# Patient Record
Sex: Female | Born: 1985 | Hispanic: Yes | Marital: Single | Smoking: Never smoker
Health system: Southern US, Community
[De-identification: ages and names within clinical notes are randomized; demographics above are authoritative.]

## PROBLEM LIST (undated history)

## (undated) DIAGNOSIS — I8392 Asymptomatic varicose veins of left lower extremity: Secondary | ICD-10-CM

## (undated) DIAGNOSIS — M5126 Other intervertebral disc displacement, lumbar region: Secondary | ICD-10-CM

## (undated) DIAGNOSIS — R519 Headache, unspecified: Secondary | ICD-10-CM

## (undated) HISTORY — DX: Headache, unspecified: R51.9

## (undated) HISTORY — DX: Other intervertebral disc displacement, lumbar region: M51.26

## (undated) HISTORY — DX: Asymptomatic varicose veins of left lower extremity: I83.92

---

## 2022-02-11 ENCOUNTER — Other Ambulatory Visit: Payer: Self-pay

## 2022-02-11 ENCOUNTER — Emergency Department: Payer: Self-pay

## 2022-02-11 ENCOUNTER — Emergency Department
Admission: EM | Admit: 2022-02-11 | Discharge: 2022-02-11 | Disposition: A | Discharge status: Home / Self Care | Payer: Self-pay | Attending: Emergency Medicine | Admitting: Emergency Medicine

## 2022-02-11 ENCOUNTER — Encounter: Payer: Self-pay | Admitting: Emergency Medicine

## 2022-02-11 DIAGNOSIS — E876 Hypokalemia: Secondary | ICD-10-CM | POA: Insufficient documentation

## 2022-02-11 DIAGNOSIS — M545 Low back pain, unspecified: Secondary | ICD-10-CM

## 2022-02-11 DIAGNOSIS — M5126 Other intervertebral disc displacement, lumbar region: Secondary | ICD-10-CM | POA: Insufficient documentation

## 2022-02-11 LAB — CBC WITH DIFFERENTIAL/PLATELET
Abs Immature Granulocytes: 0.03 10*3/uL (ref 0.00–0.07)
Basophils Absolute: 0 10*3/uL (ref 0.0–0.1)
Basophils Relative: 0 %
Eosinophils Absolute: 0 10*3/uL (ref 0.0–0.5)
Eosinophils Relative: 0 %
HCT: 38 % (ref 36.0–46.0)
Hemoglobin: 12.7 g/dL (ref 12.0–15.0)
Immature Granulocytes: 0 %
Lymphocytes Relative: 27 %
Lymphs Abs: 2.7 10*3/uL (ref 0.7–4.0)
MCH: 29.1 pg (ref 26.0–34.0)
MCHC: 33.4 g/dL (ref 30.0–36.0)
MCV: 87.2 fL (ref 80.0–100.0)
Monocytes Absolute: 0.5 10*3/uL (ref 0.1–1.0)
Monocytes Relative: 5 %
Neutro Abs: 6.5 10*3/uL (ref 1.7–7.7)
Neutrophils Relative %: 68 %
Platelets: 268 10*3/uL (ref 150–400)
RBC: 4.36 MIL/uL (ref 3.87–5.11)
RDW: 13.2 % (ref 11.5–15.5)
WBC: 9.7 10*3/uL (ref 4.0–10.5)
nRBC: 0 % (ref 0.0–0.2)

## 2022-02-11 LAB — BASIC METABOLIC PANEL
Anion gap: 9 (ref 5–15)
BUN: 7 mg/dL (ref 6–20)
CO2: 20 mmol/L — ABNORMAL LOW (ref 22–32)
Calcium: 8.6 mg/dL — ABNORMAL LOW (ref 8.9–10.3)
Chloride: 108 mmol/L (ref 98–111)
Creatinine, Ser: 0.5 mg/dL (ref 0.44–1.00)
GFR, Estimated: >60 mL/min (ref >60)
Glucose, Bld: 119 mg/dL — ABNORMAL HIGH (ref 70–99)
Potassium: 2.9 mmol/L — ABNORMAL LOW (ref 3.5–5.1)
Sodium: 137 mmol/L (ref 135–145)

## 2022-02-11 LAB — HCG, QUANTITATIVE, PREGNANCY: hCG, Beta Chain, Quant, S: <1 m[IU]/mL (ref <5)

## 2022-02-11 MED ORDER — ONDANSETRON HCL 4 MG/2ML IJ SOLN
4.0000 mg | Freq: Once | INTRAMUSCULAR | Status: AC
  Administered 2022-02-11: 4 mg via INTRAVENOUS
  Filled 2022-02-11: qty 2

## 2022-02-11 MED ORDER — ONDANSETRON 4 MG PO TBDP: 4.0000 mg | ORAL_TABLET | Freq: Four times a day (QID) | ORAL | 0 refills | Status: DC | PRN

## 2022-02-11 MED ORDER — SODIUM CHLORIDE 0.9 % IV BOLUS (SEPSIS)
1000.0000 mL | Freq: Once | INTRAVENOUS | Status: AC
Start: 2022-02-11 — End: 2022-02-11
  Administered 2022-02-11: 1000 mL via INTRAVENOUS

## 2022-02-11 MED ORDER — HYDROMORPHONE HCL 1 MG/ML IJ SOLN
1.0000 mg | Freq: Once | INTRAMUSCULAR | Status: AC
  Administered 2022-02-11: 1 mg via INTRAVENOUS
  Filled 2022-02-11: qty 1

## 2022-02-11 MED ORDER — POTASSIUM CHLORIDE CRYS ER 20 MEQ PO TBCR
40.0000 meq | EXTENDED_RELEASE_TABLET | Freq: Once | ORAL | Status: AC
  Administered 2022-02-11: 40 meq via ORAL
  Filled 2022-02-11: qty 2

## 2022-02-11 MED ORDER — LIDOCAINE 5 % EX PTCH
1.0000 | MEDICATED_PATCH | Freq: Once | CUTANEOUS | Status: DC
  Administered 2022-02-11: 1 via TRANSDERMAL
  Filled 2022-02-11: qty 1

## 2022-02-11 MED ORDER — LIDOCAINE 5 % EX PTCH: 1.0000 | MEDICATED_PATCH | CUTANEOUS | 0 refills | Status: AC

## 2022-02-11 MED ORDER — IBUPROFEN 800 MG PO TABS: 800.0000 mg | ORAL_TABLET | Freq: Three times a day (TID) | ORAL | 0 refills | Status: DC | PRN

## 2022-02-11 MED ORDER — DEXAMETHASONE SODIUM PHOSPHATE 10 MG/ML IJ SOLN
10.0000 mg | Freq: Once | INTRAMUSCULAR | Status: AC
  Administered 2022-02-11: 10 mg via INTRAVENOUS
  Filled 2022-02-11: qty 1

## 2022-02-11 MED ORDER — OXYCODONE-ACETAMINOPHEN 5-325 MG PO TABS: 2.0000 | ORAL_TABLET | Freq: Four times a day (QID) | ORAL | 0 refills | Status: DC | PRN

## 2022-02-11 MED ORDER — POTASSIUM CHLORIDE CRYS ER 20 MEQ PO TBCR: 40.0000 meq | EXTENDED_RELEASE_TABLET | Freq: Every day | ORAL | 0 refills | Status: DC

## 2022-02-11 MED ORDER — PREDNISONE 10 MG PO TABS: 60.0000 mg | ORAL_TABLET | ORAL | 0 refills | Status: DC

## 2022-02-11 MED ORDER — KETOROLAC TROMETHAMINE 30 MG/ML IJ SOLN
30.0000 mg | Freq: Once | INTRAMUSCULAR | Status: AC
Start: 2022-02-11 — End: 2022-02-11
  Administered 2022-02-11: 30 mg via INTRAVENOUS
  Filled 2022-02-11: qty 1

## 2022-02-11 NOTE — ED Provider Notes (Addendum)
? ?[facility] ?Provider Note ? ? ? Event Date/Time  ? First MD Initiated Contact with Patient 02/11/22 0305   ?  (approximate) ? ? ?History  ? ?Back Pain ? ? ?HPI ? ?Courtney Soto is a 36 y.o. female with no significant past medical history who presents to the emergency department with her family with complaints of lower back pain and unable to walk due to pain.  Family reports patient was lifting up her daughter today when she felt a pop in her lower back and was suddenly unable to move.  She has had severe pain since.  Denies numbness, tingling, weakness, bowel or bladder incontinence, urinary retention.  No fever.  No history of epidural injections other than an epidural for pregnancy a year ago.  No previous back surgeries.  Has never had issues with back pain. ? ? ?History provided by patient and family. ? ? ? ?History reviewed. No pertinent past medical history. ? ?History reviewed. No pertinent surgical history. ? ?MEDICATIONS:  ?Prior to Admission medications   ?Not on File  ? ? ?Physical Exam  ? ?Triage Vital Signs: ?ED Triage Vitals  ?Enc Vitals Group  ?   BP 02/11/22 0217 (!) 97/59  ?   Pulse Rate 02/11/22 0217 69  ?   Resp 02/11/22 0217 16  ?   Temp 02/11/22 0217 98.9 ?F (37.2 ?C)  ?   Temp Source 02/11/22 0217 Oral  ?   SpO2 02/11/22 0217 99 %  ?   Weight 02/11/22 0220 180 lb (81.6 kg)  ?   Height 02/11/22 0220 5\' 3"  (1.6 m)  ?   Head Circumference --   ?   Peak Flow --   ?   Pain Score 02/11/22 0219 10  ?   Pain Loc --   ?   Pain Edu? --   ?   Excl. in GC? --   ? ? ?Most recent vital signs: ?Vitals:  ? 02/11/22 0217 02/11/22 0532  ?BP: (!) 97/59 103/68  ?Pulse: 69 62  ?Resp: 16 17  ?Temp: 98.9 ?F (37.2 ?C) 98.8 ?F (37.1 ?C)  ?SpO2: 99% 98%  ? ? ?CONSTITUTIONAL: Alert and oriented and responds appropriately to questions.  Appears very uncomfortable, tearful, afebrile ?HEAD: Normocephalic, atraumatic ?EYES: Conjunctivae clear, pupils appear equal, sclera nonicteric ?ENT: normal  nose; moist mucous membranes ?NECK: Supple, normal ROM ?CARD: RRR; S1 and S2 appreciated; no murmurs, no clicks, no rubs, no gallops ?RESP: Normal chest excursion without splinting or tachypnea; breath sounds clear and equal bilaterally; no wheezes, no rhonchi, no rales, no hypoxia or respiratory distress, speaking full sentences ?ABD/GI: Normal bowel sounds; non-distended; soft, non-tender, no rebound, no guarding, no peritoneal signs ?BACK: The back appears normal, tender to palpation over the mid and lower lumbar spine without step-off or deformity.  No redness or warmth.  No ecchymosis.  No rash or other lesions. ?EXT: Normal ROM in all joints; no deformity noted, no edema; no cyanosis ?SKIN: Normal color for age and race; warm; no rash on exposed skin ?NEURO: Patient moves her upper extremities without difficulty but has a hard time moving her legs due to pain but the movement in her legs seems normal.  She has no hyperreflexia.  No saddle anesthesia.  Reports normal sensation diffusely.  Unable to test gait due to significant pain. ?PSYCH: The patient's mood and manner are appropriate. ? ? ?ED Results / Procedures / Treatments  ? ?LABS: ?(all labs ordered are listed, but only  abnormal results are displayed) ?Labs Reviewed  ?BASIC METABOLIC PANEL - Abnormal; Notable for the following components:  ?    Result Value  ? Potassium 2.9 (*)   ? CO2 20 (*)   ? Glucose, Bld 119 (*)   ? Calcium 8.6 (*)   ? All other components within normal limits  ?HCG, QUANTITATIVE, PREGNANCY  ?CBC WITH DIFFERENTIAL/PLATELET  ? ? ? ?EKG: ? ? EKG Interpretation ? ?Date/Time:  Saturday February 11 2022 05:55:43 EDT ?Ventricular Rate:  64 ?PR Interval:  166 ?QRS Duration: 106 ?QT Interval:  425 ?QTC Calculation: 439 ?R Axis:   56 ?Text Interpretation: Sinus arrhythmia Confirmed by Rochele RaringWard, Kasai Beltran [id number]) on 02/11/2022 6:14:09 AM ?  ? ?  ? ? ? ? ?RADIOLOGY: ?My personal review and interpretation of imaging: MRI shows L5-S1 central disc  herniation. ? ?I have personally reviewed all radiology reports.   ?MR LUMBAR SPINE WO CONTRAST ? ?Result Date: 02/11/2022 ?CLINICAL DATA:  36 year old female with acute onset low back pain ?heard a pop? when holding daughter. EXAM: MRI LUMBAR SPINE WITHOUT CONTRAST TECHNIQUE: Multiplanar, multisequence MR imaging of the lumbar spine was performed. No intravenous contrast was administered. COMPARISON:  None. FINDINGS: Segmentation: Lumbar segmentation appears to be normal and will be designated as such for this report. Alignment:  Straightening of lumbar lordosis. No spondylolisthesis. Vertebrae: Generalized decreased T1 marrow signal in the visible spine and pelvis, but no associated STIR hyperintensity or marrow edema. No discrete bone lesion. Visible pelvis appears intact. Conus medullaris and cauda equina: Conus extends to the T12 level. No lower spinal cord or conus signal abnormality. Capacious spinal canal. Normal cauda equina nerve roots. Paraspinal and other soft tissues: Negative; small benign appearing right renal cyst (no imaging follow-up recommended). Disc levels: T11-T12 through L2-L3: Negative. L3-L4: Negative disc. Mild facet hypertrophy. Capacious spinal canal and no stenosis. L4-L5: Negative. L5-S1: Disc desiccation. Small to moderate sized central disc protrusion (series 9, image 33) in proximity to the descending S1 nerve roots and thecal sac although underlying epidural lipomatosis here. No nerve root impingement or significant spinal stenosis. Mild facet hypertrophy. No foraminal involvement or stenosis. IMPRESSION: 1. 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 Underlying capacious spinal canal with no spinal stenosis, although this disc might be a source of S1 radiculitis. 2. Nonspecific generalized decreased T1 marrow signal in the visible spine and pelvis. This is nonspecific, query Anemia. Although this can be caused by marrow infiltrative  processes, other common causes include smoking and obesity. Electronically Signed   By: Odessa FlemingH  Hall M.D.   On: 02/11/2022 04:52   ? ? ?PROCEDURES: ? ?Critical Care performed: No ? ? ?CRITICAL CARE ?Performed by: Baxter HireKristen Nyshaun Standage ? ? ?Total critical care time: 0 minutes ? ?Critical care time was exclusive of separately billable procedures and treating other patients. ? ?Critical care was necessary to treat or prevent imminent or life-threatening deterioration. ? ?Critical care was time spent personally by me on the following activities: development of treatment plan with patient and/or surrogate as well as nursing, discussions with consultants, evaluation of patient's response to treatment, examination of patient, obtaining history from patient or surrogate, ordering and performing treatments and interventions, ordering and review of laboratory studies, ordering and review of radiographic studies, pulse oximetry and re-evaluation of patient's condition. ? ? ?Procedures ? ? ? ?IMPRESSION / MDM / ASSESSMENT AND PLAN / ED COURSE  ?I reviewed the triage vital signs and the nursing notes. ? ? ? ?  Patient here with sudden onset lower back pain after picking up her daughter.  Now unable to ambulate. ? ? ? ? ?DIFFERENTIAL DIAGNOSIS (includes but not limited to):   Suspect ruptured disc.  Less likely fracture, cauda equina, epidural abscess or hematoma, discitis or osteomyelitis, transverse myelitis.  Muscle strain, spasm also on the differential. ? ? ?PLAN: Given patient is not able to ambulate and appears to be in severe pain, will obtain MRI of the lumbar spine.  I suspect that she has a ruptured disc which is why she heard/felt a pop today.  We will give pain medication, steroids, anti-inflammatories. ? ? ?MEDICATIONS GIVEN IN ED: ?Medications  ?lidocaine (LIDODERM) 5 % 1 patch (1 patch Transdermal Patch Applied 02/11/22 0418)  ?HYDROmorphone (DILAUDID) injection 1 mg (1 mg Intravenous Given 02/11/22 0417)  ?sodium chloride 0.9 %  bolus 1,000 mL (0 mLs Intravenous Stopped 02/11/22 0551)  ?ketorolac (TORADOL) 30 MG/ML injection 30 mg (30 mg Intravenous Given 02/11/22 0415)  ?ondansetron [facility]) injection 4 mg (4 mg Intravenous Given 02/11/22 0414)  ?de

## 2022-02-11 NOTE — ED Triage Notes (Signed)
Pt reports yesterday afternoon she tried to hold her daughter who weights approximately 25 lbs, pt reports she heard a loud "pop" and has no pt been able to talk without assistance. Reports able to void but needs assistance to go to he bathroom. EMS administered of Fentanyl in route, pt reports medication helped some. Received IV infusion per EMS.  ?

## 2022-02-11 NOTE — ED Notes (Signed)
Patient transported to MRI 

## 2022-02-11 NOTE — ED Notes (Signed)
Discharge medications reviewed with patient via MD using interpreter regarding breast feeding at home and possible side effects. ?

## 2022-02-11 NOTE — ED Notes (Signed)
Pt ambulated with assistance x 1. Tolerated well.  ?

## 2022-02-11 NOTE — Discharge Instructions (Addendum)

## 2022-05-31 ENCOUNTER — Encounter: Payer: Self-pay | Admitting: Gerontology

## 2022-05-31 ENCOUNTER — Ambulatory Visit: Payer: Self-pay | Admitting: Gerontology

## 2022-05-31 ENCOUNTER — Other Ambulatory Visit: Payer: Self-pay

## 2022-05-31 VITALS — BP 110/77 | HR 82 | Temp 98.2°F | Wt 180.3 lb

## 2022-05-31 DIAGNOSIS — Z7689 Persons encountering health services in other specified circumstances: Secondary | ICD-10-CM | POA: Insufficient documentation

## 2022-05-31 DIAGNOSIS — Z8542 Personal history of malignant neoplasm of other parts of uterus: Secondary | ICD-10-CM | POA: Insufficient documentation

## 2022-05-31 DIAGNOSIS — M545 Low back pain, unspecified: Secondary | ICD-10-CM | POA: Insufficient documentation

## 2022-05-31 DIAGNOSIS — K029 Dental caries, unspecified: Secondary | ICD-10-CM | POA: Insufficient documentation

## 2022-05-31 MED ORDER — NAPROXEN 250 MG PO TABS
250.0000 mg | ORAL_TABLET | Freq: Two times a day (BID) | ORAL | 1 refills | Status: DC
  Filled 2022-05-31: qty 24, 12d supply, fill #0

## 2022-05-31 NOTE — Progress Notes (Signed)
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 She has not had a pap smear in 3 years. Back pain when standing and doing chores.  Pain scale 6-7.

## 2022-05-31 NOTE — Progress Notes (Signed)
New Patient Office Visit  Subjective    Patient ID: Courtney Soto, female    DOB: Jun 05, 1986  Age: 36 y.o. MRN: [id number]  CC:  Chief Complaint  Patient presents with   Back Pain   Annual Exam    Patient is having pain in back when standing and doing chores.  Pain level 6-7    HPI Courtney Soto is  a 36 y/o female who has no significant past medical history presents to establish care. She c/o intermittent back pain that started 3 months ago after her back popped while picking up her daughter.  She was seen at the emergency room on 02/11/2022 for complaint of back pain.  MRI of the spine done showed,1. 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 Underlying capacious spinal canal with no spinal stenosis, although this disc might be a source of S1 radiculitis.   2. Nonspecific generalized decreased T1 marrow signal in the visible spine and pelvis. This is nonspecific, query Anemia. Although this can be caused by marrow infiltrative processes, other common causes include smoking and obesity.  She describes pain as dull 3/10, nonradiating to her mid lower back.  She states that taking ibuprofen relieves symptoms.  She reports that standing and mopping aggravates pain.  She denies bowel, bladder incontinence, saddle anesthesia, motor no muscle weakness.  She also reports that she has not had Pap smear done.  Also complain tooth cavities, denies pain erythema and abscess.  Overall, she states that she is doing well and offers no further complaint.    Outpatient Encounter Medications as of 05/31/2022  Medication Sig   naproxen (NAPROSYN) 250 MG tablet Take 1 tablet (250 mg total) by mouth 2 (two) times daily with a meal.   ibuprofen (ADVIL) 800 MG tablet Take 1 tablet (800 mg total) by mouth every 8 (eight) hours as needed for mild pain.   [DISCONTINUED] ondansetron (ZOFRAN-ODT) 4 MG disintegrating tablet Take 1 tablet (4 mg total) by mouth every 6  (six) hours as needed for nausea or vomiting.   [DISCONTINUED] oxyCODONE-acetaminophen (PERCOCET/ROXICET) 5-325 MG tablet Take 2 tablets by mouth every 6 (six) hours as needed.   [DISCONTINUED] potassium chloride SA (KLOR-CON M) 20 MEQ tablet Take 2 tablets (40 mEq total) by mouth daily.   [DISCONTINUED] predniSONE (DELTASONE) 10 MG tablet Take 6 tablets (60 mg total) by mouth as directed. Take 60 mg x 2 days then 50 mg x 2 days then 40 mg x 2 days then 30 mg x 2 days then 20 mg x 2 days then 10 mg x 2 days then STOP   No facility-administered encounter medications on file as of 05/31/2022.    No past medical history on file.  No past surgical history on file.  No family history on file.  Social History   Socioeconomic History   Marital status: Single    Spouse name: Not on file   Number of children: Not on file   Years of education: Not on file   Highest education level: Not on file  Occupational History   Not on file  Tobacco Use   Smoking status: Never   Smokeless tobacco: Never  Substance and Sexual Activity   Alcohol use: Never   Drug use: Never   Sexual activity: Never  Other Topics Concern   Not on file  Social History Narrative   Not on file   Social Determinants of Health   Financial Resource Strain: Not  on file  Food Insecurity: Not on file  Transportation Needs: Not on file  Physical Activity: Not on file  Stress: Not on file  Social Connections: Not on file  Intimate Partner Violence: Not At Risk (05/31/2022)   Humiliation, Afraid, Rape, and Kick questionnaire    Fear of Current or Ex-Partner: No    Emotionally Abused: No    Physically Abused: No    Sexually Abused: No    Review of Systems  Constitutional: Negative.   HENT: Negative.    Eyes: Negative.   Respiratory: Negative.    Cardiovascular: Negative.   Gastrointestinal: Negative.   Genitourinary: Negative.   Musculoskeletal:  Positive for back pain.  Skin: Negative.   Neurological:  Negative.   Endo/Heme/Allergies: Negative.   Psychiatric/Behavioral: Negative.          Objective    BP 110/77 (BP Location: Right Arm, Patient Position: Sitting, Cuff Size: Large)   Pulse 82   Temp 98.2 F (36.8 C) (Oral)   Wt 180 lb 4.8 oz (81.8 kg)   LMP 05/05/2022 (Approximate)   SpO2 98%   BMI 31.94 kg/m   Physical Exam HENT:     Head: Normocephalic and atraumatic.     Nose: Nose normal.     Mouth/Throat:     Mouth: Mucous membranes are moist.     Pharynx: Oropharynx is clear.  Eyes:     Extraocular Movements: Extraocular movements intact.     Conjunctiva/sclera: Conjunctivae normal.     Pupils: Pupils are equal, round, and reactive to light.  Cardiovascular:     Rate and Rhythm: Normal rate and regular rhythm.     Pulses: Normal pulses.     Heart sounds: Normal heart sounds.  Pulmonary:     Effort: Pulmonary effort is normal.     Breath sounds: Normal breath sounds.  Abdominal:     General: Bowel sounds are normal.     Palpations: Abdomen is soft.  Genitourinary:    Comments: Deferred per patient Musculoskeletal:        General: Tenderness (mild tenderness to mid lower back with palpation) present. Normal range of motion.     Cervical back: Normal range of motion.  Skin:    General: Skin is warm.  Neurological:     General: No focal deficit present.     Mental Status: She is alert and oriented to person, place, and time. Mental status is at baseline.  Psychiatric:        Mood and Affect: Mood normal.        Behavior: Behavior normal.        Thought Content: Thought content normal.        Judgment: Judgment normal.         Assessment & Plan:   1. Encounter to establish care -Routine labs will be checked.  She was provided with [place] number to call and schedule Pap smear. - Comp Met (CMET); Future - Lipid panel; Future - HgB A1c; Future - HgB A1c - Lipid panel - Comp Met (CMET)  2. Chronic midline low back pain,  unspecified whether sciatica present -She was started on naproxen to 250 mg twice daily for back pain, was educated on medication side effect and advised to notify clinic.  She will follow-up with Dr. Jefm Bryant for further evaluation of back pain.  She was advised to go to the emergency room for worsening symptoms. - naproxen (NAPROSYN) 250 MG tablet; Take 1 tablet (250 mg total)  by mouth 2 (two) times daily with a meal.  Dispense: 30 tablet; Refill: 1  3. Dental cavities -She was provided with dental application for referral.   Return in about 3 weeks (around 06/21/2022), or if symptoms worsen or fail to improve.   Burgandy Hackworth Jerold Coombe, NP

## 2022-06-01 LAB — HEMOGLOBIN A1C
Est. average glucose Bld gHb Est-mCnc: 97 mg/dL
Hgb A1c MFr Bld: 5 % (ref 4.8–5.6)

## 2022-06-01 LAB — COMPREHENSIVE METABOLIC PANEL
ALT: 11 IU/L (ref 0–32)
AST: 15 IU/L (ref 0–40)
Albumin/Globulin Ratio: 1.7 (ref 1.2–2.2)
Albumin: 4.8 g/dL (ref 3.9–4.9)
Alkaline Phosphatase: 110 IU/L (ref 44–121)
BUN/Creatinine Ratio: 12 (ref 9–23)
BUN: 8 mg/dL (ref 6–20)
Bilirubin Total: 0.6 mg/dL (ref 0.0–1.2)
CO2: 21 mmol/L (ref 20–29)
Calcium: 9.7 mg/dL (ref 8.7–10.2)
Chloride: 104 mmol/L (ref 96–106)
Creatinine, Ser: 0.65 mg/dL (ref 0.57–1.00)
Globulin, Total: 2.8 g/dL (ref 1.5–4.5)
Glucose: 80 mg/dL (ref 70–99)
Potassium: 3.9 mmol/L (ref 3.5–5.2)
Sodium: 141 mmol/L (ref 134–144)
Total Protein: 7.6 g/dL (ref 6.0–8.5)
eGFR: 118 mL/min/{1.73_m2} (ref >59)

## 2022-06-01 LAB — LIPID PANEL
Chol/HDL Ratio: 2.8 ratio (ref 0.0–4.4)
Cholesterol, Total: 142 mg/dL (ref 100–199)
HDL: 50 mg/dL (ref >39)
LDL Chol Calc (NIH): 73 mg/dL (ref 0–99)
Triglycerides: 106 mg/dL (ref 0–149)
VLDL Cholesterol Cal: 19 mg/dL (ref 5–40)

## 2022-06-07 ENCOUNTER — Other Ambulatory Visit: Payer: Self-pay

## 2022-06-12 ENCOUNTER — Ambulatory Visit (LOCAL_COMMUNITY_HEALTH_CENTER): Payer: Self-pay | Admitting: Nurse Practitioner

## 2022-06-12 ENCOUNTER — Encounter: Payer: Self-pay | Admitting: Nurse Practitioner

## 2022-06-12 VITALS — BP 108/73 | Ht 63.0 in | Wt 179.4 lb

## 2022-06-12 DIAGNOSIS — Z113 Encounter for screening for infections with a predominantly sexual mode of transmission: Secondary | ICD-10-CM

## 2022-06-12 DIAGNOSIS — Z3009 Encounter for other general counseling and advice on contraception: Secondary | ICD-10-CM

## 2022-06-12 DIAGNOSIS — Z01419 Encounter for gynecological examination (general) (routine) without abnormal findings: Secondary | ICD-10-CM

## 2022-06-12 DIAGNOSIS — Z3202 Encounter for pregnancy test, result negative: Secondary | ICD-10-CM

## 2022-06-12 LAB — WET PREP FOR TRICH, YEAST, CLUE: Trichomonas Exam: NEGATIVE

## 2022-06-12 LAB — HM HIV SCREENING LAB: HM HIV Screening: NEGATIVE

## 2022-06-12 LAB — PREGNANCY, URINE: Preg Test, Ur: NEGATIVE

## 2022-06-12 NOTE — Progress Notes (Signed)
Patient seen for PE, STD testing, PAP. PT, BCM. Wet prep reviewed, no tx per standing orders. Condoms given. Nexplanon appt made for 06/22/22. Patient instructed to use condoms, to prevent pregnancy.

## 2022-06-12 NOTE — Progress Notes (Addendum)
[facility] DEPARTMENT [facility] [address] Main Number: [phone]    Family Planning Visit- Initial Visit  Subjective:  Courtney Soto is a 36 y.o.  [phone]   being seen today for an initial annual visit and to discuss reproductive life planning.  The patient is currently using Female Condom for pregnancy prevention. Patient reports   does not want a pregnancy in the next year.  Desires a Nexplanon today.   report they are looking for a method that provides High efficacy at preventing pregnancy  Patient has the following medical conditions has Encounter to establish care; History of uterine cancer; Low back pain; and Dental cavities on their problem list.  Chief Complaint  Patient presents with   Contraception    Physical and birth control     Patient reports to clinic today for a physical, STD screening, and discussion of birth control.    Body mass index is 31.78 kg/m. - Patient is eligible for diabetes screening based on BMI and age >26?  not applicable HA1C ordered? not applicable  Patient reports 1  partner/s in last year. Desires STI screening?  Yes  Has patient been screened once for HCV in the past?  No  No results found for: "HCVAB"  Does the patient have current drug use (including MJ), have a partner with drug use, and/or has been incarcerated since last result? No  If yes-- Screen for HCV through [facility]   Does the patient meet criteria for HBV testing? No  Criteria:  -Household, sexual or needle sharing contact with HBV -History of drug use -HIV positive -Those with known Hep C   Health Maintenance Due  Topic Date Due   COVID-19 Vaccine (1) Never done   HIV Screening  Never done   Hepatitis C Screening  Never done   PAP SMEAR-Modifier  Never done    Review of Systems  Constitutional:  Negative for chills, fever, malaise/fatigue and weight loss.  HENT:  Negative for congestion, hearing loss and  sore throat.   Eyes:  Negative for blurred vision, double vision and photophobia.  Respiratory:  Negative for shortness of breath.   Cardiovascular:  Negative for chest pain.  Gastrointestinal:  Negative for abdominal pain, blood in stool, constipation, diarrhea, heartburn, nausea and vomiting.  Genitourinary:  Negative for dysuria and frequency.       Vaginal discharge and burning x 2 weeks   Musculoskeletal:  Negative for back pain, joint pain and neck pain.  Skin:  Negative for itching and rash.  Neurological:  Positive for headaches. Negative for dizziness and weakness.  Endo/Heme/Allergies:  Bruises/bleeds easily.  Psychiatric/Behavioral:  Negative for depression, substance abuse and suicidal ideas.     The following portions of the patient's history were reviewed and updated as appropriate: allergies, current medications, past family history, past medical history, past social history, past surgical history and problem list. Problem list updated.   See flowsheet for other program required questions.  Objective:   Vitals:   06/12/22 1447  BP: 108/73  Weight: 179 lb 6.4 oz (81.4 kg)  Height: 5\' 3"  (1.6 m)    Physical Exam Constitutional:      Appearance: Normal appearance.  HENT:     Head: Normocephalic. No abrasion, masses or laceration. Hair is normal.     Jaw: No tenderness or swelling.     Right Ear: External ear normal.     Left Ear: External ear normal.  Nose: Nose normal.     Mouth/Throat:     Lips: Pink. No lesions.     Mouth: Mucous membranes are moist. No lacerations or oral lesions.     Dentition: No dental caries.     Tongue: No lesions.     Palate: No mass and lesions.     Pharynx: No pharyngeal swelling, oropharyngeal exudate, posterior oropharyngeal erythema or uvula swelling.     Tonsils: No tonsillar exudate or tonsillar abscesses.  Eyes:     Pupils: Pupils are equal, round, and reactive to light.  Neck:     Thyroid: No thyroid mass, thyromegaly  or thyroid tenderness.  Cardiovascular:     Rate and Rhythm: Normal rate and regular rhythm.  Pulmonary:     Effort: Pulmonary effort is normal.     Breath sounds: Normal breath sounds.  Chest:  Breasts:    Right: Mass present. No swelling, nipple discharge, skin change or tenderness.     Left: Normal. No swelling, mass, nipple discharge, skin change or tenderness.       Comments: 2 x 2 cm non tender thickening/mass noted to right breast at 12 o'clock  Abdominal:     General: Abdomen is flat. Bowel sounds are normal.     Palpations: Abdomen is soft.     Tenderness: There is abdominal tenderness in the left lower quadrant. There is no rebound.  Genitourinary:    Pubic Area: No rash or pubic lice.      Labia:        Right: No rash, tenderness or lesion.        Left: No rash, tenderness or lesion.      Vagina: Normal. No vaginal discharge, erythema, tenderness or lesions.     Cervix: Cervical motion tenderness present. No discharge, lesion or erythema.     Uterus: Normal.      Adnexa:        Right: No tenderness.         Left: No tenderness.       Rectum: Normal.     Comments: Amount Discharge: small  Odor: No pH: less than 4.5 Adheres to vaginal wall: No Color: color of discharge matches the  swab Musculoskeletal:     Cervical back: Full passive range of motion without pain and normal range of motion.     Comments: Varicose veins noted to the left lower quadrant.    Lymphadenopathy:     Cervical: No cervical adenopathy.     Right cervical: No superficial, deep or posterior cervical adenopathy.    Left cervical: No superficial, deep or posterior cervical adenopathy.     Upper Body:     Right upper body: No supraclavicular, axillary or epitrochlear adenopathy.     Left upper body: No supraclavicular, axillary or epitrochlear adenopathy.     Lower Body: No right inguinal adenopathy. No left inguinal adenopathy.  Skin:    General: Skin is warm and dry.     Findings: No  erythema, laceration, lesion or rash.  Neurological:     Mental Status: She is alert and oriented to person, place, and time.  Psychiatric:        Attention and Perception: Attention normal.        Mood and Affect: Mood normal.        Speech: Speech normal.        Behavior: Behavior normal. Behavior is cooperative.       Assessment and Plan:  Courtney Soto is a  36 y.o. female presenting to the [facility] Department for an initial annual wellness/contraceptive visit  Contraception counseling: Reviewed options based on patient desire and reproductive life plan. Patient is interested in Female Condom. This was provided to the patient today.   Risks, benefits, and typical effectiveness rates were reviewed.  Questions were answered.  Written information was also given to the patient to review.    The patient will follow up in  1 years for surveillance.  The patient was told to call with any further questions, or with any concerns about this method of contraception.  Emphasized use of condoms 100% of the time for STI prevention.  Need for ECP was assessed. ECP not offered due to reports of no unprotected sex.  UPT today, PT negative.    1. Family planning counseling -36 year old female in clinic today for a physical, STD screening, and to discuss birth control. -ROS reviewed, patient reports vaginal discharge, headaches, and easiness to bruise.  Patient agrees to STD screening today.  Patient also advised to drink 6-8 bottles of water and eat 6 small meals frequently.  Patient also encouraged to receive an updated eye exam, obtain a PCP, and get proper rest.  -Varicose veins noted to left lower quadrant.  Encouraged to continue to follow up with PCP and also encouraged patient to utilize support stockings.  Resources for purchase given to patient.    -Patient desires a Nexplanon but is unable to stay for placement.  Patient encouraged to schedule an appointment and advised to  use condoms with all sex and/or abstain from sex until placement of Nexplanon.   2. Screening examination for venereal disease -STD screening today. -Patient accepted all screenings including oral, vaginal, rectal CT/GC, wet prep, and bloodwork for HIV/RPR.  Patient meets criteria for HepB screening? No. Ordered? No - low risk  Patient meets criteria for HepC screening? No. Ordered? No - low risk   Treat wet prep per standing order Discussed time line for State Lab results and that patient will be called with positive results and encouraged patient to call if she had not heard in 2 weeks.  Counseled to return or seek care for continued or worsening symptoms Recommended condom use with all sex  Patient is currently using  condoms   to prevent pregnancy.    - HIV [place] [facility] - Syphilis Serology, [facility] - Chlamydia/Gonorrhea [facility] - Chlamydia/Gonorrhea [place] - Chlamydia/Gonorrhea [place] [facility] - WET PREP FOR TRICH, YEAST, CLUE  3. Well woman exam with routine gynecological exam -Well woman exam. -CBE today.   2 x 2 cm non tender thickening/mass noted to right breast noted at 12 o'clock.  Referral submitted to [facility]CP.   -PAP performed today.   - IGP, Aptima HPV   Total time spent: 30 minutes   Return in about 1 year (around 06/13/2023) for Annual well-woman exam.  Future Appointments  Date Time Provider Department Center  06/21/2022 12:30 PM [place], Francoise Ceo, NP [facility] None    Glenna Fellows, FNP

## 2022-06-15 LAB — IGP, APTIMA HPV
HPV Aptima: NEGATIVE
PAP Smear Comment: 0

## 2022-06-21 ENCOUNTER — Other Ambulatory Visit: Payer: Self-pay

## 2022-06-21 ENCOUNTER — Ambulatory Visit: Payer: Self-pay | Admitting: Gerontology

## 2022-06-21 ENCOUNTER — Encounter: Payer: Self-pay | Admitting: Gerontology

## 2022-06-21 VITALS — BP 104/70 | HR 94 | Temp 98.8°F | Resp 18 | Wt 182.3 lb

## 2022-06-21 DIAGNOSIS — I8310 Varicose veins of unspecified lower extremity with inflammation: Secondary | ICD-10-CM | POA: Insufficient documentation

## 2022-06-21 DIAGNOSIS — I8392 Asymptomatic varicose veins of left lower extremity: Secondary | ICD-10-CM

## 2022-06-21 DIAGNOSIS — I8390 Asymptomatic varicose veins of unspecified lower extremity: Secondary | ICD-10-CM | POA: Insufficient documentation

## 2022-06-21 NOTE — Progress Notes (Unsigned)
Established Patient Office Visit  Subjective   Patient ID: Courtney Soto, female    DOB: 22-Jan-1986  Age: 36 y.o. MRN: [id number]  Chief Complaint  Patient presents with   Follow-up    HPI  Ayanna Gheen is  a 36 y/o female who has no significant past medical history presents for follow up and lab review. She c/o varicose vein to her left leg that is painful with standing up. She states that it has been going on for many months.  Review of Systems  Constitutional: Negative.   Eyes: Negative.   Respiratory: Negative.    Cardiovascular: Negative.   Skin: Negative.   Neurological: Negative.   Psychiatric/Behavioral: Negative.        Objective:     BP 104/70 (BP Location: Right Arm, Patient Position: Sitting, Cuff Size: Large)   Pulse 94   Temp 98.8 F (37.1 C) (Oral)   Resp 18   Wt 182 lb 4.8 oz (82.7 kg)   LMP 05/05/2022 (Approximate)   SpO2 98%   BMI 32.29 kg/m  BP Readings from Last 3 Encounters:  06/21/22 104/70  06/12/22 108/73  05/31/22 110/77   Wt Readings from Last 3 Encounters:  06/21/22 182 lb 4.8 oz (82.7 kg)  06/12/22 179 lb 6.4 oz (81.4 kg)  05/31/22 180 lb 4.8 oz (81.8 kg)      Physical Exam   No results found for any visits on 06/21/22.  Last CBC Lab Results  Component Value Date   WBC 9.7 02/11/2022   HGB 12.7 02/11/2022   HCT 38.0 02/11/2022   MCV 87.2 02/11/2022   MCH 29.1 02/11/2022   RDW 13.2 02/11/2022   PLT 268 61/95/0932   Last metabolic panel Lab Results  Component Value Date   GLUCOSE 80 05/31/2022   NA 141 05/31/2022   K 3.9 05/31/2022   CL 104 05/31/2022   CO2 21 05/31/2022   BUN 8 05/31/2022   CREATININE 0.65 05/31/2022   EGFR 118 05/31/2022   CALCIUM 9.7 05/31/2022   PROT 7.6 05/31/2022   ALBUMIN 4.8 05/31/2022   LABGLOB 2.8 05/31/2022   AGRATIO 1.7 05/31/2022   BILITOT 0.6 05/31/2022   ALKPHOS 110 05/31/2022   AST 15 05/31/2022   ALT 11 05/31/2022   ANIONGAP 9 02/11/2022   Last lipids Lab  Results  Component Value Date   CHOL 142 05/31/2022   HDL 50 05/31/2022   LDLCALC 73 05/31/2022   TRIG 106 05/31/2022   CHOLHDL 2.8 05/31/2022   Last hemoglobin A1c Lab Results  Component Value Date   HGBA1C 5.0 05/31/2022      The ASCVD Risk score (Arnett DK, et al., 2019) failed to calculate for the following reasons:   The 2019 ASCVD risk score is only valid for ages 53 to 21    Assessment & Plan:   Problem List Items Addressed This Visit   None   No follow-ups on file.    Lacey Dotson Jerold Coombe, NP

## 2022-06-22 ENCOUNTER — Ambulatory Visit: Payer: Self-pay

## 2022-06-22 ENCOUNTER — Ambulatory Visit (LOCAL_COMMUNITY_HEALTH_CENTER): Payer: Self-pay | Admitting: Family Medicine

## 2022-06-22 VITALS — BP 100/75 | Ht 63.0 in | Wt 177.4 lb

## 2022-06-22 DIAGNOSIS — Z3009 Encounter for other general counseling and advice on contraception: Secondary | ICD-10-CM

## 2022-06-22 DIAGNOSIS — Z30017 Encounter for initial prescription of implantable subdermal contraceptive: Secondary | ICD-10-CM

## 2022-06-22 MED ORDER — ETONOGESTREL 68 MG ~~LOC~~ IMPL
68.0000 mg | DRUG_IMPLANT | Freq: Once | SUBCUTANEOUS | Status: AC
  Administered 2022-06-22: 68 mg via SUBCUTANEOUS

## 2022-06-22 NOTE — Progress Notes (Signed)
Pt here for Nexplanon insertion. Had PE and UPT last week. Procedure was explained via spanish interpretor, all consents were signed and all questions answered. Nexplanon inserted by Dr. Ralene Bathe without complications. Pt given post-procedure instructions and Nexplanon card.  Lethea Killings RN

## 2022-06-22 NOTE — Progress Notes (Signed)
[facility] Department  S: Patient was seen on 7/31 for physical. Could not stay for placement at that time. She had UPT that was negative at that visit and has not had sex since that visit.   O: BP 100/75 (BP Location: Right Arm, Patient Position: Sitting)   Ht 5\' 3"  (1.6 m)   Wt 177 lb 6.4 oz (80.5 kg)   LMP 05/05/2022 (Approximate)   BMI 31.42 kg/m    Nexplanon Insertion Procedure Patient identified, informed consent performed, consent signed.   Patient does understand that irregular bleeding is a very common side effect of this medication. She was advised to have backup contraception after placement. Patient was determined to meet WHO criteria for not being pregnant.    Appropriate time out taken.  The insertion site was identified 8-10 cm (3-4 inches) from the medial epicondyle of the humerus and 3-5 cm (1.25-2 inches) posterior to (below) the sulcus (groove) between the biceps and triceps muscles of the patient's left arm and marked.  Patient was prepped with alcohol swab and then injected with 3 ml of 1% lidocaine.  Arm was prepped with chlorhexidene, Nexplanon removed from packaging,  Device confirmed in needle, then inserted full length of needle and withdrawn per handbook instructions. Nexplanon was able to palpated in the patient's arm; patient palpated the insert herself. There was minimal blood loss.  Patient insertion site covered with guaze and a pressure bandage to reduce any bruising.  The patient tolerated the procedure well and was given post procedure instructions.    A/P  - inserted nexplanon - Counseled on bleeding profile  05/07/2022, MD

## 2022-07-10 ENCOUNTER — Other Ambulatory Visit: Payer: Self-pay

## 2022-07-10 DIAGNOSIS — N6310 Unspecified lump in the right breast, unspecified quadrant: Secondary | ICD-10-CM

## 2022-07-14 ENCOUNTER — Ambulatory Visit: Payer: Self-pay | Attending: Hematology and Oncology

## 2022-07-25 ENCOUNTER — Ambulatory Visit: Payer: Self-pay | Attending: Hematology and Oncology | Admitting: *Deleted

## 2022-07-25 ENCOUNTER — Ambulatory Visit: Payer: Self-pay | Admitting: Gerontology

## 2022-07-25 VITALS — BP 112/87 | Wt 176.2 lb

## 2022-07-25 DIAGNOSIS — Z01419 Encounter for gynecological examination (general) (routine) without abnormal findings: Secondary | ICD-10-CM

## 2022-07-25 NOTE — Progress Notes (Signed)
Courtney Soto is a 36 y.o. female who presents to [facility] clinic today with no complaints. Patient was referred from the [facility] Department for further evaluation of a left breast mass found on clinical exam about 1 month ago.  Patient states her breast were engorged at the time of the exam.   Pap Smear: Pap not smear completed today. Last Pap smear was 06/12/22 at [facility] Department clinic and was  negative / negative . Per patient has no history of an abnormal Pap smear. Last Pap smear result is available in Epic.   Physical exam: Breasts Physical Exam Chest:  Breasts:    Right: No swelling, bleeding, inverted nipple, mass, nipple discharge, skin change or tenderness.     Left: No swelling, bleeding, inverted nipple, mass, nipple discharge, skin change or tenderness.    Lymphadenopathy:     Upper Body:     Right upper body: No supraclavicular or axillary adenopathy.     Left upper body: No supraclavicular or axillary adenopathy.     Pelvic/Bimanual Pap is not indicated today    Smoking History: Patient has never smoked    Patient Navigation: Patient education provided. Taught self breast awareness.  Access to services provided for patient through [facility] program. Kandis Cocking the [facility] interpreter provided interpretation.. No transportation provided   Colorectal Cancer Screening: Per patient has never had colonoscopy completed.  She does not meet screening guidelines based on age.  No complaints today.    Breast and Cervical Cancer Risk Assessment: Patient has family history of breast cancer in there maternal grandmother, no known genetic mutations, or radiation treatment to the chest before age 79. Patient does not have history of cervical dysplasia, immunocompromised, or DES exposure in-utero.  Risk Assessment     Risk Scores       07/25/2022   Last edited by: Narda Rutherford, LPN   5-year risk: 0.2 %   Lifetime risk: 7.1 %             A: [facility] exam without pap smear CP: Referred patient to the [facility] for a screening mammogram. Appointment scheduled on 07/27/2022 at 1040.  Will follow up per [facility] protocol.  Jim Like, RN 07/25/2022 2:41 PM

## 2022-07-25 NOTE — Progress Notes (Deleted)
  Subjective:     Patient ID: Courtney Soto, female   DOB: May 02, 1986, 36 y.o.   MRN: [id number]  HPI   Review of Systems     Objective:   Physical Exam Chest:  Breasts:    Right: No swelling, bleeding, inverted nipple, mass, nipple discharge, skin change or tenderness.     Left: No swelling, bleeding, inverted nipple, mass, nipple discharge, skin change or tenderness.    Lymphadenopathy:     Upper Body:     Right upper body: No supraclavicular or axillary adenopathy.     Left upper body: No supraclavicular or axillary adenopathy.      Assessment:     ***    Plan:     ***

## 2022-07-27 ENCOUNTER — Ambulatory Visit
Admission: RE | Admit: 2022-07-27 | Discharge: 2022-07-27 | Disposition: A | Discharge status: Home / Self Care | Payer: Self-pay | Source: Ambulatory Visit | Attending: Obstetrics and Gynecology | Admitting: Obstetrics and Gynecology

## 2022-07-27 DIAGNOSIS — N6310 Unspecified lump in the right breast, unspecified quadrant: Secondary | ICD-10-CM

## 2022-08-03 ENCOUNTER — Ambulatory Visit: Payer: Self-pay | Admitting: Gerontology

## 2022-08-03 VITALS — BP 109/77 | HR 71 | Resp 16 | Wt 179.1 lb

## 2022-08-03 DIAGNOSIS — I8392 Asymptomatic varicose veins of left lower extremity: Secondary | ICD-10-CM

## 2022-08-03 NOTE — Patient Instructions (Signed)
Venas varicosas Varicose Veins Las venas varicosas son venas que se han agrandado y abultado, y se han tornado sinuosas. Suelen aparecer en las piernas. Cules son las causas? La causa de esta afeccin es el dao en las vlvulas de la vena. Estas vlvulas ayudan a que la sangre regrese al corazn. Cuando estn daadas y dejan de funcionar correctamente, la sangre puede ir en direccin inversa y retornar a las venas cerca de la piel, lo que hace que las venas se agranden y se vean sinuosas. La afeccin puede surgir por cualquier factor que haga que la sangre retorne, como un embarazo, una actividad que obligue a estar de pie de forma prolongada o la obesidad. Qu incrementa el riesgo? Los siguientes factores pueden hacer que sea ms propenso a contraer esta afeccin: Permanecer de pie mucho tiempo. Estar embarazada. Tener sobrepeso. Fumar. Haber tenido una trombosis venosa profunda previa o tener un trastorno trombtico. Envejecimiento. El riesgo aumenta con la edad. Tener una afeccin llamada sndrome de [place]. Cules son los signos o sntomas? Los sntomas de esta afeccin incluyen: Venas abultadas, sinuosas y azuladas. Sensacin de pesadez en las piernas. Estos sntomas pueden empeorar hacia el final del da. Dolor en las piernas. Estos sntomas pueden empeorar hacia el final del da. Hinchazn en la pierna. Cambios en el color de la piel que est sobre las venas. La hinchazn o el dolor en las piernas pueden limitar sus actividades. Los sntomas pueden empeorar al estar sentado o de pie durante largos perodos. Cmo se diagnostica? Esta afeccin se puede diagnosticar en funcin de lo siguiente: Sus sntomas, antecedentes familiares, niveles de actividad y estilo de vida. Un examen fsico. Tambin pueden hacerle estudios como una ecografa o radiografa. Cmo se trata? El tratamiento de esta afeccin puede incluir lo siguiente: Evitar estar sentado o de pie en la  misma posicin durante mucho tiempo. Usar medias de compresin. Estas medias ayudan a evitar la formacin de cogulos de sangre y a reducir la hinchazn de las piernas. Levantar (elevar) las piernas al descansar. Bajar de peso. Hacer actividad fsica con regularidad. Si tiene sntomas persistentes o desea mejorar el aspecto de las venas varicosas, puede optar por un procedimiento para anular dichas venas o extraerlas. Entre los tratamientos no quirrgicos para anular las venas, se incluyen los siguientes: Escleroterapia. En este tratamiento, se inyecta una solucin en la vena para anularla. Tratamiento con lser. La vena se calienta con un lser para anularla. Ablacin venosa por radiofrecuencia. Se usa una corriente elctrica que se produce mediante ondas de radio para anular la vena. Entre los tratamientos quirrgicos para extraer las venas, se incluyen los siguientes: Flebectoma. En este procedimiento, las venas se extraen a travs de pequeas incisiones que se realizan por encima de las venas. Ligadura venosa y varicectoma. En este procedimiento, se realizan incisiones por encima de las venas. Luego, las venas se extraen despus de atarse (ligarse) con puntos (suturas). Siga estas instrucciones en su casa: Medicamentos Tome los medicamentos de venta libre y los recetados solamente como se lo haya indicado el mdico. Si le recetaron un antibitico, tmelo como se lo haya indicado el mdico. No deje de usar el antibitico aunque comience a sentirse mejor. Actividad Camine todo lo que pueda. Caminar aumenta el flujo sanguneo. Esto ayuda a que la sangre regrese al corazn y alivia la presin en las venas. No permanezca de pie o sentado en una misma posicin durante mucho tiempo. No se siente con las piernas cruzadas. Evite estar sentado durante largos perodos   sin moverse. Levntese y camine un poco cada 1 a 2 horas. Esto es importante para mejorar el flujo sanguneo y la respiracin. Pida  ayuda si se siente dbil o inestable. Retome sus actividades normales segn lo indicado por el mdico. Pregntele al mdico qu actividades son seguras para usted. Haga ejercicio como se lo haya indicado el mdico. Instrucciones generales  Siga las instrucciones del mdico en lo que respecta a la dieta. De noche, eleve las piernas a una altura superior al nivel del corazn. Si se corta en la piel que est por encima de una vena varicosa y esta sangra: Recustese con la pierna levantada. Coloque un pao limpio sobre el corte y presione firmemente sobre l hasta que el sangrado se detenga. Aplique una venda (vendaje) sobre el corte. Beba suficiente lquido como para mantener la orina de color amarillo plido. No consuma ningn producto que contenga nicotina o tabaco. Estos productos incluyen cigarrillos, tabaco para mascar y aparatos de vapeo, como los cigarrillos electrnicos. Si necesita ayuda para dejar de fumar, consulte al mdico. Use medias de compresin como se lo haya indicado su mdico. No use otra clase de vestimenta ajustada alrededor de las piernas, la pelvis o la cintura. Concurra a todas las visitas de seguimiento. Esto es importante. Comunquese con un mdico si: La piel alrededor de las venas varicosas empieza a agrietarse. Siente ms dolor o tiene enrojecimiento, sensibilidad o hinchazn dura sobre la vena. Est incmodo debido al dolor. Se corta en la piel de encima de una vena varicosa y no deja de sangrar. Solicite ayuda de inmediato si: Siente dolor en el pecho. Tiene dificultad para respirar. Siente dolor intenso en la pierna. Resumen Las venas varicosas son venas que se han agrandado y abultado, y se han tornado sinuosas. Suelen aparecer en las piernas. La causa de esta afeccin es el dao en las vlvulas de la vena. Estas vlvulas ayudan a que la sangre regrese al corazn. El tratamiento de esta afeccin incluye hacer movimientos frecuentes, usar medias de compresin,  perder peso y hacer ejercicios de forma regular. En algunos casos, se realizan procedimientos que anulan o extraen las venas. Los tratamientos no quirrgicos para cerrar las venas incluyen escleroterapia, terapia lser y ablacin venosa por radiofrecuencia. Esta informacin no tiene como fin reemplazar el consejo del mdico. Asegrese de hacerle al mdico cualquier pregunta que tenga. Document Revised: 05/02/2021 Document Reviewed: 05/02/2021 Elsevier Patient Education  2023 [employer]  

## 2022-08-03 NOTE — Progress Notes (Signed)
Established Patient Office Visit  Subjective   Patient ID: Courtney Soto, female    DOB: [birth year]  Age: 36 y.o. MRN: [id number]  CC: Follow-up for varicose veins of left leg.   HPI  Courtney Soto is  a 36 y/o female who has a history of varicose vein in left leg. She was last seen on 06/21/22 at the clinic and given compression stockings. She was instructed to complete the [facility] financial application so that she could see Vascular Surgery. She has still been experiencing pain in her left leg that she describes as "stinging" and "heaviness." Her pain will get up to a 9/10 and is only relieved by rest. She occasionally reports redness in her left leg, but no warmth and she denies claudication. Additionally, on 07/27/22 she had an ultrasound of her right breast that was engorged after breastfeeding. The ultrasound showed no abnormalities nor signs of malignancy. Overall, she states that she's doing well and offers no further complaint.   Review of Systems  Constitutional: Negative.   Respiratory: Negative.    Cardiovascular: Negative.   Musculoskeletal: Negative.   Neurological: Negative.       Objective:     LMP 06/12/2022 (Exact Date)  BP Readings from Last 3 Encounters:  08/03/22 109/77  07/25/22 112/87  06/22/22 100/75   Wt Readings from Last 3 Encounters:  08/03/22 179 lb 1.6 oz (81.2 kg)  07/25/22 176 lb 3.2 oz (79.9 kg)  06/22/22 177 lb 6.4 oz (80.5 kg)      Physical Exam Constitutional:      Appearance: Normal appearance. She is normal weight.  Cardiovascular:     Rate and Rhythm: Normal rate and regular rhythm.     Pulses: Normal pulses.     Heart sounds: Normal heart sounds.  Pulmonary:     Effort: Pulmonary effort is normal.     Breath sounds: Normal breath sounds.  Musculoskeletal:        General: Normal range of motion.  Skin:    General: Skin is warm.     Comments: Varicose veins on left lower extremity to anterior knee and tibia. There is no  erythema or edema. No ulcers present.  Neurological:     General: No focal deficit present.     Mental Status: She is alert and oriented to person, place, and time. Mental status is at baseline.  Psychiatric:        Mood and Affect: Mood normal.        Behavior: Behavior normal.        Thought Content: Thought content normal.        Judgment: Judgment normal.      No results found for any visits on 08/03/22.  Last CBC Lab Results  Component Value Date   WBC 9.7 02/11/2022   HGB 12.7 02/11/2022   HCT 38.0 02/11/2022   MCV 87.2 02/11/2022   MCH 29.1 02/11/2022   RDW 13.2 02/11/2022   PLT 268 40/98/1191   Last metabolic panel Lab Results  Component Value Date   GLUCOSE 80 05/31/2022   NA 141 05/31/2022   K 3.9 05/31/2022   CL 104 05/31/2022   CO2 21 05/31/2022   BUN 8 05/31/2022   CREATININE 0.65 05/31/2022   EGFR 118 05/31/2022   CALCIUM 9.7 05/31/2022   PROT 7.6 05/31/2022   ALBUMIN 4.8 05/31/2022   LABGLOB 2.8 05/31/2022   AGRATIO 1.7 05/31/2022   BILITOT 0.6 05/31/2022   ALKPHOS 110 05/31/2022   AST  15 05/31/2022   ALT 11 05/31/2022   ANIONGAP 9 02/11/2022   Last lipids Lab Results  Component Value Date   CHOL 142 05/31/2022   HDL 50 05/31/2022   LDLCALC 73 05/31/2022   TRIG 106 05/31/2022   CHOLHDL 2.8 05/31/2022   Last hemoglobin A1c Lab Results  Component Value Date   HGBA1C 5.0 05/31/2022      The ASCVD Risk score (Arnett DK, et al., 2019) failed to calculate for the following reasons:   The 2019 ASCVD risk score is only valid for ages 34 to 85    Assessment & Plan:   1. Varicose veins of left lower extremity, unspecified whether complicated - She should continue to wear her compression stockings daily. Elevate her left leg and rest as needed. She should fill out the charity care form so that she can see Vascular Surgery. Application given today at check-out.  She was advised to go to the ED for worsening symptoms.  Follow-up in two  months.  Rayvon Char

## 2022-09-28 ENCOUNTER — Ambulatory Visit: Payer: Self-pay

## 2022-09-28 VITALS — BP 105/73 | HR 69 | Temp 98.3°F | Resp 16 | Ht 63.0 in | Wt 176.9 lb

## 2022-09-28 DIAGNOSIS — I8392 Asymptomatic varicose veins of left lower extremity: Secondary | ICD-10-CM

## 2022-09-28 DIAGNOSIS — R5383 Other fatigue: Secondary | ICD-10-CM

## 2022-09-28 NOTE — Progress Notes (Unsigned)
Established Patient Office Visit  Subjective   Patient ID: Courtney Soto, female    DOB: 04-27-1986  Age: 36 y.o. 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 She was last seen on 08/03/22 regarding pain in her left leg related to her varicose veins. She was encouraged to complete the [facility] financial application so that she could see Vascular Surgery, in addition to wearing compression stockings, elevate the legs, and exercise. She has not filled out the form, but plans to turn it in this Tuesday. She says her lower left leg "burns" and "stings" and is worse at the end of the day. She has no issues with her right leg. She wears her compression daily, but is not wearing them currently. She says the compression socks do not help, but applying vicks vapor rub does provide some relief. She also endorses fatigue over the past month. She states she is chronically tired and naps during the day. However, she states she sleeps well at night and does not have issues falling or staying asleep. She denies headaches nor dizziness. She offers no further complaints.   Review of Systems  Constitutional:  Positive for malaise/fatigue.  Respiratory: Negative.    Cardiovascular:  Positive for leg swelling (in left leg).  Musculoskeletal: Negative.   Neurological: Negative.  Negative for dizziness and headaches.  Endo/Heme/Allergies: Negative.   Psychiatric/Behavioral: Negative.        Objective:     There were no vitals taken for this visit. BP Readings from Last 3 Encounters:  08/03/22 109/77  07/25/22 112/87  06/22/22 100/75   Wt Readings from Last 3 Encounters:  08/03/22 179 lb 1.6 oz (81.2 kg)  07/25/22 176 lb 3.2 oz (79.9 kg)  06/22/22 177 lb 6.4 oz (80.5 kg)      Physical Exam Constitutional:      Appearance: Normal appearance. She is normal weight.  HENT:     Head:  Normocephalic and atraumatic.  Cardiovascular:     Rate and Rhythm: Normal rate and regular rhythm.     Pulses: Normal pulses.     Heart sounds: Normal heart sounds.     Comments: Varicose veins on left lower extremity to anterior knee and tibia. There is no erythema or edema. No ulcers present.  Pulmonary:     Effort: Pulmonary effort is normal.     Breath sounds: Normal breath sounds.  Skin:    General: Skin is warm and dry.  Neurological:     General: No focal deficit present.     Mental Status: She is alert and oriented to person, place, and time. Mental status is at baseline.  Psychiatric:        Mood and Affect: Mood normal.        Behavior: Behavior normal.        Thought Content: Thought content normal.        Judgment: Judgment normal.      No results found for any visits on 09/28/22.  Last CBC Lab Results  Component Value Date   WBC 9.7 02/11/2022   HGB 12.7 02/11/2022   HCT 38.0 02/11/2022   MCV 87.2 02/11/2022   MCH 29.1 02/11/2022   RDW 13.2 02/11/2022   PLT 268 25/00/3704   Last metabolic panel Lab Results  Component Value Date   GLUCOSE 80 05/31/2022   NA 141  05/31/2022   K 3.9 05/31/2022   CL 104 05/31/2022   CO2 21 05/31/2022   BUN 8 05/31/2022   CREATININE 0.65 05/31/2022   EGFR 118 05/31/2022   CALCIUM 9.7 05/31/2022   PROT 7.6 05/31/2022   ALBUMIN 4.8 05/31/2022   LABGLOB 2.8 05/31/2022   AGRATIO 1.7 05/31/2022   BILITOT 0.6 05/31/2022   ALKPHOS 110 05/31/2022   AST 15 05/31/2022   ALT 11 05/31/2022   ANIONGAP 9 02/11/2022   Last lipids Lab Results  Component Value Date   CHOL 142 05/31/2022   HDL 50 05/31/2022   LDLCALC 73 05/31/2022   TRIG 106 05/31/2022   CHOLHDL 2.8 05/31/2022   Last hemoglobin A1c Lab Results  Component Value Date   HGBA1C 5.0 05/31/2022     The ASCVD Risk score (Arnett DK, et al., 2019) failed to calculate for the following reasons:   The 2019 ASCVD risk score is only valid for ages 59 to 55     Assessment & Plan:   1. Fatigue, unspecified type - Unsure why she is experiencing fatigue. Will collect TSH, Vitamin D, and CBC today. She was educated on sleep hygiene. Call or return to clinic if symptoms worsen. - TSH; Future - Vitamin D (25 hydroxy); Future - CBC w/Diff; Future - TSH  2. Varicose veins of left lower extremity, unspecified whether complicated - Her varicose veins are still present and bothering her. She still needs to fill out the [facility] financial application so that she can see a vein specialist.    Return in about 3 months (around 12/27/2022), or if symptoms worsen or fail to improve.   Rayvon Char, FNP Student

## 2022-09-28 NOTE — Patient Instructions (Addendum)
Fatiga Fatigue Si siente fatiga, tiene una sensacin de cansancio en todo momento y falta de energa o falta de motivacin. La fatiga puede dificultar el inicio o la realizacin de tareas debido al agotamiento. La fatiga ocasional o leve con frecuencia es una reaccin normal a la actividad o la vida. Sin embargo, la fatiga de Engineer, site duracin (crnica) o extrema puede ser sntoma de una afeccin mdica, por ejemplo: Depresin. No tener suficientes glbulos rojos o hemoglobina en la sangre (anemia). Un problema con Ardelia Mems glndula pequea ubicada en la parte inferior delantera del cuello (trastorno tiroideo). Afecciones reumatolgicas. Estos son problemas relacionados con el sistema de defensa del cuerpo (sistema inmunitario). Infecciones, especialmente ciertas infecciones virales. Con el transcurso del [place], la fatiga tambin puede tener consecuencias negativas en la salud. Siga estas instrucciones en su casa: Medicamentos Use los medicamentos de venta libre y los recetados solamente como se lo haya indicado el mdico. Tome una multivitamina, si se lo indic el mdico. No tome ningn complemento alimenticio o a base de hierbas a menos que lo haya autorizado el mdico. Comida y bebida  Evite las comidas abundantes a la noche. Consuma una dieta bien balanceada que incluya protenas magras, cereales integrales, abundantes frutas, verduras y productos lcteos descremados. Evite comer o beber demasiados productos que contengan cafena. Evite tomar alcohol. Beber suficiente lquido como para Theatre manager la orina de color amarillo plido. Actividad  Realice ejercicio con regularidad como se lo haya indicado el mdico. Practique tcnicas que lo ayuden a Nurse, children's, como yoga, tai chi, meditacin, masoterapia. [place] situaciones que le provocan estrs. Trate de que sus horarios de [place] y personal estn equilibrados. No use drogas ilegales ni recreativas. Instrucciones  generales Controle su fatiga para ver si hay cambios. Acustese y levntese a la misma hora todos [place]. Evite la fatiga moderando su ritmo [place] y durmiendo lo suficiente durante la noche. Mantenga un peso saludable. Comunquese con un mdico si: La fatiga no mejora. Tiene fiebre. Pierde peso o [place] de peso de forma repentina. Tiene dolores de [place]. Tiene problemas para conciliar el sueo o para dormir en la noche. Se siente enojado, culpable, ansioso o triste. Observa hinchazn en las piernas u otras partes del cuerpo. Solicite ayuda de inmediato si: Se siente confundido, siente que va a desmayarse o se desmaya. Tiene la visin borrosa o tiene dolor de cabeza intenso. Siente dolor intenso en el abdomen, la espalda o el rea entre la cintura y las caderas (pelvis). Tiene dolor de pecho, dificultad para respirar, o latidos cardacos irregulares o acelerados. No puede orinar u [employer] de lo normal. Presenta sangrado anormal del recto, la nariz, los pulmones, los pezones o, si es [place], de la vagina. Vomita sangre. Tiene pensamientos acerca de Sport and exercise psychologist a Producer, television/film/video. Estos sntomas pueden Sales executive. Solicite ayuda de inmediato. Llame al 911. No espere a ver si los sntomas desaparecen. No conduzca por sus propios medios [employer]. Busque ayuda de inmediato si alguna vez siente que puede hacerse dao a usted mismo o a otros, o tiene pensamientos de Doctor, hospital a su vida. Dirjase al centro de urgencias ms cercano o: Llame al 911. Llame a National Suicide Prevention Lifeline ([place] para la Prevencin del Suicidio) al [phone] o al 988. Est disponible las 24 horas del da. Enve un mensaje de texto a la lnea para casos de crisis al [phone]. Resumen Si siente fatiga, tiene una sensacin de cansancio en todo  momento y falta de energa o falta de motivacin. La fatiga puede dificultar el inicio o la realizacin  de tareas debido al agotamiento. La fatiga de larga duracin (crnica) o extrema puede ser sntoma de una afeccin mdica. Realice ejercicio con regularidad como se lo haya indicado el mdico. Cambie las situaciones que le provocan estrs. Trate de que sus horarios de [place] y personal estn equilibrados. Esta informacin no tiene Marine scientist el consejo del mdico. Asegrese de hacerle al mdico cualquier pregunta que tenga. Document Revised: 09/08/2021 Document Reviewed: 09/08/2021 Elsevier Patient Education  [place] Varicose Veins Las venas varicosas son venas que se han agrandado y [place], y se han tornado sinuosas. Suelen aparecer en las piernas. Cules son las causas? La causa de esta afeccin es el dao en las vlvulas de la vena. Estas vlvulas ayudan a que la sangre regrese al [employer]. Cuando estn daadas y dejan de funcionar correctamente, la sangre puede ir en direccin inversa y Writer a las venas cerca de la piel, lo que hace que las venas se agranden y se vean sinuosas. La afeccin puede surgir por cualquier factor que haga que la sangre retorne, como un [place], una actividad que obligue a estar de pie de forma prolongada o la obesidad. Qu incrementa el riesgo? Los siguientes factores pueden hacer que sea ms propenso a Emergency planning/management officer afeccin: Public affairs consultant de Academic librarian. Estar embarazada. Tener sobrepeso. Fumar. Haber tenido una trombosis venosa profunda previa o tener un trastorno trombtico. Envejecimiento. El riesgo aumenta con la edad. Tener una afeccin llamada sndrome de [place]. Cules son los signos o sntomas? Los sntomas de esta afeccin incluyen: Venas abultadas, sinuosas y [place]. Sensacin de [facility]. Estos sntomas pueden empeorar hacia el final del da. Dolor en las piernas. Estos sntomas pueden empeorar hacia el final del da. Hinchazn en la pierna. Cambios en el color de la  piel que est [employer]. La hinchazn o Conservation officer, historic buildings en las piernas pueden limitar sus [place]. Los sntomas pueden empeorar al estar sentado o de pie durante largos perodos. Cmo se diagnostica? Esta afeccin se puede diagnosticar en funcin de lo siguiente: Sus sntomas, antecedentes familiares, niveles de [place] y [place] de vida. Un examen fsico. Tambin pueden hacerle estudios como una ecografa o radiografa. Cmo se trata? El tratamiento de esta afeccin puede incluir lo siguiente: Clinical cytogeneticist sentado o de pie en la misma posicin durante mucho tiempo. Usar medias de compresin. Estas medias ayudan a Mining engineer formacin de cogulos de [place] y a reducir la hinchazn de las piernas. Levantar (elevar) las piernas al descansar. Bajar de [place]. Hacer actividad fsica con regularidad. Si tiene sntomas persistentes o desea mejorar el aspecto de las venas varicosas, puede optar por un procedimiento para anular dichas venas o extraerlas. Entre los tratamientos no quirrgicos para anular las venas, se incluyen los siguientes: Public affairs consultant. En este tratamiento, se inyecta una solucin en la vena para anularla. Tratamiento con lser. La vena se calienta con un lser para anularla. Ablacin venosa por radiofrecuencia. Se [place] una corriente elctrica que se produce mediante ondas de radio para anular la vena. Entre los tratamientos quirrgicos para extraer las venas, se incluyen los siguientes: [place]. En este procedimiento, las venas se extraen a travs de pequeas incisiones que se realizan por encima de las venas. Ligadura venosa y [place]. En este procedimiento, se realizan incisiones por encima de las venas. Luego, las venas se extraen despus de atarse (ligarse) con puntos (suturas).  Siga estas instrucciones en su casa: Medicamentos Tome los medicamentos de venta libre y los recetados solamente como se lo haya indicado el mdico. Si le recetaron un antibitico, tmelo  como se lo haya indicado el mdico. No deje de usar el antibitico aunque comience a Sports administrator. Actividad [place] todo lo que pueda. Caminar aumenta el flujo sanguneo. Esto ayuda a que la sangre regrese al corazn y Engineer, production la presin en las venas. No permanezca de pie o sentado en una misma posicin [employer]. No se siente con las piernas cruzadas. Evite estar sentado durante largos perodos sin moverse. Levntese y camine un poco cada 1 a 2 horas. Esto es importante para mejorar el flujo sanguneo y la respiracin. Pida ayuda si se siente dbil o inestable. Retome sus actividades normales segn lo indicado por el mdico. Pregntele al mdico qu actividades son seguras para usted. Haga ejercicio como se lo haya indicado el mdico. Instrucciones generales  Siga las instrucciones del mdico en lo que respecta a la dieta. De noche, eleve las piernas a una altura superior al nivel del corazn. Si se corta en la piel que est por encima de una vena varicosa y esta sangra: Recustese con la pierna levantada. Coloque un pao limpio sobre el corte y presione firmemente sobre l hasta que el sangrado se [place]. Aplique una venda (vendaje) sobre el corte. Beba suficiente lquido como para Theatre manager la orina de color amarillo plido. No consuma ningn producto que contenga nicotina o tabaco. Estos productos incluyen cigarrillos, tabaco para Higher education careers adviser y aparatos de vapeo, como los Psychologist, sport and exercise. Si necesita ayuda para dejar de fumar, consulte al [employer]. Use medias de compresin como se lo haya indicado su mdico. No use otra clase de vestimenta ajustada alrededor de las piernas, la pelvis o la cintura. Concurra a [place]. Esto es importante. Comunquese con un mdico si: La piel alrededor de las venas varicosas empieza a Medical illustrator. Siente ms dolor o tiene enrojecimiento, sensibilidad o hinchazn dura sobre la vena. Est incmodo debido al dolor. Se  corta en la piel de encima de una vena varicosa y no deja de Therapist, art. Solicite ayuda de inmediato si: Electronics engineer. Tiene dificultad para respirar. Siente dolor intenso en la pierna. Resumen Las venas varicosas son venas que se han agrandado y [place], y se han tornado sinuosas. Suelen aparecer en las piernas. La causa de esta afeccin es el dao en las vlvulas de la vena. Estas vlvulas ayudan a que la sangre regrese al [employer]. El tratamiento de esta afeccin incluye hacer movimientos frecuentes, usar medias de compresin, perder peso y Field seismologist ejercicios de forma regular. En algunos casos, se realizan procedimientos que anulan o [place] venas. Los tratamientos no quirrgicos para cerrar las venas incluyen escleroterapia, terapia lser y ablacin venosa por radiofrecuencia. Esta informacin no tiene Marine scientist el consejo del mdico. Asegrese de hacerle al mdico cualquier pregunta que tenga. Document Revised: 05/02/2021 Document Reviewed: 05/02/2021 Elsevier Patient Education  [place].

## 2022-09-29 LAB — TSH: TSH: 1.15 u[IU]/mL (ref 0.450–4.500)

## 2022-10-19 ENCOUNTER — Ambulatory Visit: Payer: Self-pay

## 2022-10-26 ENCOUNTER — Ambulatory Visit: Payer: Self-pay | Admitting: Nurse Practitioner

## 2022-10-26 VITALS — BP 118/80 | HR 89 | Temp 98.0°F | Ht 63.0 in | Wt 176.7 lb

## 2022-10-26 DIAGNOSIS — N390 Urinary tract infection, site not specified: Secondary | ICD-10-CM

## 2022-10-26 MED ORDER — NITROFURANTOIN MONOHYD MACRO 100 MG PO CAPS
100.0000 mg | ORAL_CAPSULE | Freq: Two times a day (BID) | ORAL | 0 refills | Status: DC
  Filled 2022-10-26: qty 10, 5d supply, fill #0

## 2022-10-26 NOTE — Progress Notes (Signed)
Established Patient Office Visit  Subjective   Patient ID: Courtney Soto, female    DOB: 1986/05/20  Age: 36 y.o. MRN: [id number]  Chief Complaint  Patient presents with   Dysuria    3 weeks, blood when wiping, urinary retention    Courtney Soto is  a 36 y/o female who has a history of varicose veins in her left leg. She presents today in clinic with a complaint of urgency, frequency, burring and spotting with urination. Symptoms have been present for 3 weeks and has tried to improve symptoms with increasing water intake and taking cranberry tablets.  Patient reports no improvement.     Review of Systems  Constitutional:  Negative for chills, fever, malaise/fatigue and weight loss.  HENT:  Negative for congestion, hearing loss and sore throat.   Eyes:  Negative for blurred vision, double vision and photophobia.  Respiratory:  Negative for shortness of breath.   Cardiovascular:  Negative for chest pain.  Gastrointestinal:  Negative for abdominal pain, blood in stool, constipation, diarrhea, heartburn, nausea and vomiting.  Genitourinary:  Positive for dysuria. Negative for frequency.  Musculoskeletal:  Negative for back pain, joint pain and neck pain.  Skin:  Negative for itching and rash.  Neurological:  Negative for dizziness, weakness and headaches.  Endo/Heme/Allergies:  Does not bruise/bleed easily.  Psychiatric/Behavioral:  Negative for depression, substance abuse and suicidal ideas.       Objective:     BP 118/80 (BP Location: Right Arm, Patient Position: Sitting, Cuff Size: Normal)   Pulse 89   Temp 98 F (36.7 C) (Oral)   Ht _0  (1.6 m)   Wt 176 lb 11.2 oz (80.2 kg)   SpO2 98%   BMI 31.30 kg/m  BP Readings from Last 3 Encounters:  10/26/22 118/80  09/28/22 105/73  08/03/22 109/77   Wt Readings from Last 3 Encounters:  10/26/22 176 lb 11.2 oz (80.2 kg)  09/28/22 176 lb 14.4 oz (80.2 kg)  08/03/22 179 lb 1.6 oz (81.2 kg)      Physical  Exam Constitutional:      Appearance: Normal appearance.  HENT:     Head: Normocephalic.     Right Ear: External ear normal.     Left Ear: External ear normal.     Nose: Nose normal.     Mouth/Throat:     Lips: Pink.     Mouth: Mucous membranes are moist.  Cardiovascular:     Rate and Rhythm: Normal rate and regular rhythm.  Pulmonary:     Effort: Pulmonary effort is normal.     Breath sounds: Normal breath sounds.  Abdominal:     General: Abdomen is flat. Bowel sounds are normal.     Palpations: Abdomen is soft.     Tenderness: There is abdominal tenderness. There is no right CVA tenderness or left CVA tenderness.  Musculoskeletal:     Cervical back: Full passive range of motion without pain, normal range of motion and neck supple.  Skin:    General: Skin is warm and dry.  Neurological:     Mental Status: She is alert and oriented to person, place, and time.  Psychiatric:        Attention and Perception: Attention normal.        Mood and Affect: Mood normal.        Speech: Speech normal.        Behavior: Behavior normal. Behavior is cooperative.      No results found for  any visits on 10/26/22.  Last CBC Lab Results  Component Value Date   WBC 9.7 02/11/2022   HGB 12.7 02/11/2022   HCT 38.0 02/11/2022   MCV 87.2 02/11/2022   MCH 29.1 02/11/2022   RDW 13.2 02/11/2022   PLT 268 58/68/2574   Last metabolic panel Lab Results  Component Value Date   GLUCOSE 80 05/31/2022   NA 141 05/31/2022   K 3.9 05/31/2022   CL 104 05/31/2022   CO2 21 05/31/2022   BUN 8 05/31/2022   CREATININE 0.65 05/31/2022   EGFR 118 05/31/2022   CALCIUM 9.7 05/31/2022   PROT 7.6 05/31/2022   ALBUMIN 4.8 05/31/2022   LABGLOB 2.8 05/31/2022   AGRATIO 1.7 05/31/2022   BILITOT 0.6 05/31/2022   ALKPHOS 110 05/31/2022   AST 15 05/31/2022   ALT 11 05/31/2022   ANIONGAP 9 02/11/2022   Last lipids Lab Results  Component Value Date   CHOL 142 05/31/2022   HDL 50 05/31/2022   LDLCALC  73 05/31/2022   TRIG 106 05/31/2022   CHOLHDL 2.8 05/31/2022   Last hemoglobin A1c Lab Results  Component Value Date   HGBA1C 5.0 05/31/2022   Last thyroid functions Lab Results  Component Value Date   TSH 1.150 09/28/2022   Last vitamin D No results found for: "25OHVITD2", "25OHVITD3", "VD25OH" Last vitamin B12 and Folate No results found for: "VITAMINB12", "FOLATE"    The ASCVD Risk score (Arnett DK, et al., 2019) failed to calculate for the following reasons:   The 2019 ASCVD risk score is only valid for ages 83 to 19    Assessment & Plan:   1. Urinary tract infection with hematuria, site unspecified -36 year old female in clinic today with a complaint of urgency, frequency, burring and spotting with urination. -Urinalysis obtained today. -Will treat patient today for a UTI.  Patient given Macrobid 100 MG PO BID for 5 days.  Patient to complete medication, increase water intake, empty bladder frequently, and ensure that she is wiping front to back. Will follow up as needed or if no improvement.   - Urinalysis, Routine w reflex microscopic; Future - Urinalysis, Routine w reflex microscopic - nitrofurantoin, macrocrystal-monohydrate, (MACROBID) 100 MG capsule; Take 1 capsule (100 mg total) by mouth 2 (two) times daily.  Dispense: 10 capsule; Refill: 0    Return if symptoms worsen or fail to improve.    Gregary Cromer, FNP

## 2022-10-27 ENCOUNTER — Other Ambulatory Visit: Payer: Self-pay

## 2022-10-27 LAB — URINALYSIS, ROUTINE W REFLEX MICROSCOPIC
Bilirubin, UA: NEGATIVE
Glucose, UA: NEGATIVE
Ketones, UA: NEGATIVE
Leukocytes,UA: NEGATIVE
Nitrite, UA: NEGATIVE
Protein,UA: NEGATIVE
Specific Gravity, UA: 1.013 (ref 1.005–1.030)
Urobilinogen, Ur: 0.2 mg/dL (ref 0.2–1.0)
pH, UA: 5.5 (ref 5.0–7.5)

## 2022-10-27 LAB — MICROSCOPIC EXAMINATION
Bacteria, UA: NONE SEEN
Casts: NONE SEEN /lpf
WBC, UA: NONE SEEN /hpf (ref 0–5)

## 2022-11-17 ENCOUNTER — Other Ambulatory Visit: Payer: Self-pay

## 2022-12-28 ENCOUNTER — Ambulatory Visit: Payer: Self-pay | Admitting: Obstetrics and Gynecology

## 2022-12-28 ENCOUNTER — Ambulatory Visit: Payer: Self-pay

## 2022-12-28 VITALS — BP 114/78 | HR 68 | Temp 98.1°F | Resp 16 | Wt 179.0 lb

## 2022-12-28 DIAGNOSIS — I8392 Asymptomatic varicose veins of left lower extremity: Secondary | ICD-10-CM

## 2022-12-28 NOTE — Progress Notes (Signed)
Courtney Reusing, NP   Chief Complaint  Patient presents with   Follow-up    Varicose vein f/u; review urine lab results from 12/23    HPI:      Courtney Soto is a 37 y.o. [phone] whose LMP was No LMP recorded. (Menstrual status: Lactating)., presents today for varicose vein f/u. Is trying to schedule appt at [facility] vascular but needs more paperwork completed. Received that from [place]. She is wearing compression socks and elevating legs at night without sx relief. FH varicose veins but hers is the worst.  Pt treated for UTI 12/23 with abx and wanted to understand lab results. Feels much better, no UTI sx now.  No other issues tonight.   Patient Active Problem List   Diagnosis Date Noted   Fatigue 09/28/2022   Varicose vein of leg 06/21/2022   Encounter to establish care 05/31/2022   History of uterine cancer 05/31/2022   Low back pain 05/31/2022   Dental cavities 05/31/2022    No past surgical history on file.  Family History  Problem Relation Age of Onset   Healthy Mother    Kidney disease Father    Healthy Sister    Breast cancer Maternal Grandmother    Healthy Brother     Social History   Socioeconomic History   Marital status: Single    Spouse name: Not on file   Number of children: 3   Years of education: Not on file   Highest education level: 8th grade  Occupational History   Not on file  Tobacco Use   Smoking status: Never   Smokeless tobacco: Never  Vaping Use   Vaping Use: Never used  Substance and Sexual Activity   Alcohol use: Never   Drug use: Never   Sexual activity: Yes    Birth control/protection: Condom, Implant  Other Topics Concern   Not on file  Social History Narrative   Not on file   Social Determinants of Health   Financial Resource Strain: Not on file  Food Insecurity: No Food Insecurity (07/25/2022)   Hunger Vital Sign    Worried About Running Out of Food in the Last Year: Never true    Ran Out of Food in  the Last Year: Never true  Transportation Needs: Unmet Transportation Needs (07/25/2022)   PRAPARE - Hydrologist (Medical): Yes    Lack of Transportation (Non-Medical): Yes  Physical Activity: Not on file  Stress: Not on file  Social Connections: Not on file  Intimate Partner Violence: Not At Risk (06/12/2022)   Humiliation, Afraid, Rape, and Kick questionnaire    Fear of Current or Ex-Partner: No    Emotionally Abused: No    Physically Abused: No    Sexually Abused: No    Outpatient Medications Prior to Visit  Medication Sig Dispense Refill   ibuprofen (ADVIL) 800 MG tablet Take 1 tablet (800 mg total) by mouth every 8 (eight) hours as needed for mild pain. (Patient not taking: Reported on 09/28/2022) 30 tablet 0   naproxen (NAPROSYN) 250 MG tablet Take 1 tablet (250 mg total) by mouth 2 (two) times daily with a meal. (Patient not taking: Reported on 09/28/2022) 30 tablet 1   nitrofurantoin, macrocrystal-monohydrate, (MACROBID) 100 MG capsule Take 1 capsule (100 mg total) by mouth 2 (two) times daily. 10 capsule 0   No facility-administered medications prior to visit.      ROS:  Review of Systems  Constitutional:  Negative for fatigue, fever and unexpected weight change.  Respiratory:  Negative for cough, shortness of breath and wheezing.   Cardiovascular:  Negative for chest pain, palpitations and leg swelling.  Gastrointestinal:  Negative for blood in stool, constipation, diarrhea, nausea and vomiting.  Endocrine: Negative for cold intolerance, heat intolerance and polyuria.  Genitourinary:  Negative for dyspareunia, dysuria, flank pain, frequency, genital sores, hematuria, menstrual problem, pelvic pain, urgency, vaginal bleeding, vaginal discharge and vaginal pain.  Musculoskeletal:  Negative for back pain, joint swelling and myalgias.  Skin:  Negative for rash.  Neurological:  Negative for dizziness, syncope, light-headedness, numbness and  headaches.  Hematological:  Negative for adenopathy.  Psychiatric/Behavioral:  Negative for agitation, confusion, sleep disturbance and suicidal ideas. The patient is not nervous/anxious.    BREAST: No symptoms   OBJECTIVE:   Vitals:  BP 114/78 (BP Location: Right Arm, Patient Position: Sitting, Cuff Size: Large)   Pulse 68   Temp 98.1 F (36.7 C)   Resp 16   Wt 179 lb (81.2 kg)   SpO2 98%   BMI 31.71 kg/m   Physical Exam Constitutional:      Appearance: Normal appearance.  Pulmonary:     Effort: Pulmonary effort is normal.  Musculoskeletal:        General: Normal range of motion.     Right lower leg: No edema.     Left lower leg: No edema.     Comments: LT LEG VARICOSE VEINS  Neurological:     Mental Status: She is alert and oriented to person, place, and time.  Psychiatric:        Judgment: Judgment normal.     Assessment/Plan: Varicose veins of left lower extremity, unspecified whether complicated--complete form for Vascular appt ref; cont compression socks/elevation/NSAIDs prn.      No follow-ups on file.  Abbegail Matuska B. Ahmet Schank, PA-C 12/28/2022 7:27 PM

## 2023-01-29 ENCOUNTER — Other Ambulatory Visit (INDEPENDENT_AMBULATORY_CARE_PROVIDER_SITE_OTHER): Payer: Self-pay | Admitting: Nurse Practitioner

## 2023-01-29 DIAGNOSIS — I8392 Asymptomatic varicose veins of left lower extremity: Secondary | ICD-10-CM

## 2023-01-31 ENCOUNTER — Ambulatory Visit (INDEPENDENT_AMBULATORY_CARE_PROVIDER_SITE_OTHER): Payer: Self-pay

## 2023-01-31 ENCOUNTER — Encounter (INDEPENDENT_AMBULATORY_CARE_PROVIDER_SITE_OTHER): Payer: Self-pay | Admitting: Nurse Practitioner

## 2023-01-31 ENCOUNTER — Ambulatory Visit (INDEPENDENT_AMBULATORY_CARE_PROVIDER_SITE_OTHER): Payer: Self-pay | Admitting: Nurse Practitioner

## 2023-01-31 VITALS — BP 107/76 | HR 84 | Resp 18 | Ht 62.5 in | Wt 178.4 lb

## 2023-01-31 DIAGNOSIS — I83812 Varicose veins of left lower extremities with pain: Secondary | ICD-10-CM

## 2023-01-31 DIAGNOSIS — I8392 Asymptomatic varicose veins of left lower extremity: Secondary | ICD-10-CM

## 2023-02-01 ENCOUNTER — Encounter (INDEPENDENT_AMBULATORY_CARE_PROVIDER_SITE_OTHER): Payer: Self-pay | Admitting: Nurse Practitioner

## 2023-02-01 NOTE — Progress Notes (Signed)
Subjective:    Patient ID: Courtney Soto, female    DOB: 1986-09-27, 37 y.o.   MRN: [id number] Chief Complaint  Patient presents with   New Patient (Initial Visit)    np. le reflux + consult. left LE VV w/ pain. referred by Caryl Asp      The patient is seen for evaluation of symptomatic varicose veins. The patient relates burning and stinging which worsened steadily throughout the course of the day, particularly with standing. The patient also notes an aching and throbbing pain over the varicosities, particularly with prolonged dependent positions. The symptoms are significantly improved with elevation.  The patient also notes that during hot weather the symptoms are greatly intensified. The patient states the pain from the varicose veins interferes with work, daily exercise, shopping and household maintenance. At this point, the symptoms are persistent and severe enough that they're having a negative impact on lifestyle and are interfering with daily activities.  There is no history of DVT, PE or superficial thrombophlebitis. There is no history of ulceration or hemorrhage. The patient denies a significant family history of varicose veins.  The patient has worn graduated compression in the past. At the present time the patient has been using over-the-counter analgesics. There is no history of prior surgical intervention or sclerotherapy.  Today the patient has no evidence of DVT or superficial thrombophlebitis in the left lower extremity.  There is some evidence of deep venous insufficiency.  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      Review of Systems  Cardiovascular:  Positive for leg swelling.  All other systems reviewed and are negative.      Objective:   Physical Exam Vitals reviewed.  HENT:     Head: Normocephalic.  Cardiovascular:     Rate and Rhythm: Normal rate.     Pulses: Normal pulses.   Pulmonary:     Effort: Pulmonary effort is normal.  Musculoskeletal:        General: Tenderness present.  Skin:    General: Skin is warm and dry.  Neurological:     Mental Status: She is alert and oriented to person, place, and time.  Psychiatric:        Mood and Affect: Mood normal.        Behavior: Behavior normal.        Thought Content: Thought content normal.        Judgment: Judgment normal.     BP 107/76 (BP Location: Right Arm)   Pulse 84   Resp 18   Ht 5' 2.5" (1.588 m)   Wt 178 lb 6.4 oz (80.9 kg)   Breastfeeding Yes   BMI 32.11 kg/m   History reviewed. No pertinent past medical history.  Social History   Socioeconomic History   Marital status: Single    Spouse name: Not on file   Number of children: 3   Years of education: Not on file   Highest education level: 8th grade  Occupational History   Not on file  Tobacco Use   Smoking status: Never   Smokeless tobacco: Never  Vaping Use   Vaping Use: Never used  Substance and Sexual Activity   Alcohol use: Never   Drug use: Never   Sexual activity: Yes    Birth control/protection: Condom, Implant  Other Topics Concern   Not on file  Social History Narrative   Not on file   Social Determinants of Health  Financial Resource Strain: Not on file  Food Insecurity: No Food Insecurity (07/25/2022)   Hunger Vital Sign    Worried About Running Out of Food in the Last Year: Never true    Ran Out of Food in the Last Year: Never true  Transportation Needs: Unmet Transportation Needs (07/25/2022)   PRAPARE - Hydrologist (Medical): Yes    Lack of Transportation (Non-Medical): Yes  Physical Activity: Not on file  Stress: Not on file  Social Connections: Not on file  Intimate Partner Violence: Not At Risk (06/12/2022)   Humiliation, Afraid, Rape, and Kick questionnaire    Fear of Current or Ex-Partner: No    Emotionally Abused: No    Physically Abused: No    Sexually Abused: No     History reviewed. No pertinent surgical history.  Family History  Problem Relation Age of Onset   Healthy Mother    Kidney disease Father    Healthy Sister    Breast cancer Maternal Grandmother    Healthy Brother     No Known Allergies     Latest Ref Rng & Units 02/11/2022    5:05 AM  CBC  WBC 4.0 - 10.5 K/uL 9.7   Hemoglobin 12.0 - 15.0 g/dL 12.7   Hematocrit 36.0 - 46.0 % 38.0   Platelets 150 - 400 K/uL 268       CMP     Component Value Date/Time   NA 141 05/31/2022 1231   K 3.9 05/31/2022 1231   CL 104 05/31/2022 1231   CO2 21 05/31/2022 1231   GLUCOSE 80 05/31/2022 1231   GLUCOSE 119 (H) 02/11/2022 0505   BUN 8 05/31/2022 1231   CREATININE 0.65 05/31/2022 1231   CALCIUM 9.7 05/31/2022 1231   PROT 7.6 05/31/2022 1231   ALBUMIN 4.8 05/31/2022 1231   AST 15 05/31/2022 1231   ALT 11 05/31/2022 1231   ALKPHOS 110 05/31/2022 1231   BILITOT 0.6 05/31/2022 1231   GFRNONAA >60 02/11/2022 0505     No results found.     Assessment & Plan:   1. Varicose veins of left lower extremity with pain Recommend  I have reviewed my previous  discussion with the patient regarding  varicose veins and why they cause symptoms. Patient will continue  wearing graduated compression stockings class 1 on a daily basis, beginning first thing in the morning and removing them in the evening.  The patient is CEAP [id number].  The patient has been wearing compression for more than 12 weeks with no or little benefit.  The patient has been exercising daily for more than 12 weeks. The patient has been elevating and taking OTC pain medications for more than 12 weeks.  None of these have have eliminated the pain related to the varicose veins and venous reflux or the discomfort regarding venous congestion.    In addition, behavioral modification including elevation during the day was again discussed and this will continue.  The patient has utilized over the counter pain medications and has  been exercising.  However, at this time conservative therapy has not alleviated the patient's symptoms of leg pain and swelling  Recommend: laser ablation of the  left great saphenous veins to eliminate the symptoms of pain and swelling of the lower extremities caused by the severe superficial venous reflux disease.    Current Outpatient Medications on File Prior to Visit  Medication Sig Dispense Refill   ibuprofen (ADVIL) 800 MG tablet Take 1 tablet (  800 mg total) by mouth every 8 (eight) hours as needed for mild pain. 30 tablet 0   naproxen (NAPROSYN) 250 MG tablet Take 1 tablet (250 mg total) by mouth 2 (two) times daily with a meal. (Patient not taking: Reported on 09/28/2022) 30 tablet 1   nitrofurantoin, macrocrystal-monohydrate, (MACROBID) 100 MG capsule Take 1 capsule (100 mg total) by mouth 2 (two) times daily. (Patient not taking: Reported on 01/31/2023) 10 capsule 0   No current facility-administered medications on file prior to visit.    There are no Patient Instructions on file for this visit. No follow-ups on file.   Kris Hartmann, NP

## 2023-04-05 ENCOUNTER — Ambulatory Visit: Payer: Self-pay | Admitting: Gerontology

## 2023-04-05 VITALS — BP 107/74 | HR 93 | Wt 182.1 lb

## 2023-04-05 DIAGNOSIS — Z0000 Encounter for general adult medical examination without abnormal findings: Secondary | ICD-10-CM

## 2023-04-05 DIAGNOSIS — I83812 Varicose veins of left lower extremities with pain: Secondary | ICD-10-CM

## 2023-04-05 DIAGNOSIS — N898 Other specified noninflammatory disorders of vagina: Secondary | ICD-10-CM | POA: Insufficient documentation

## 2023-04-05 MED ORDER — MICONAZOLE NITRATE 2 % VA CREA
1.0000 | TOPICAL_CREAM | Freq: Every day | VAGINAL | 0 refills | Status: DC
Start: 2023-04-06 — End: 2023-04-05

## 2023-04-05 MED ORDER — MICONAZOLE NITRATE 2 % VA CREA
1.0000 | TOPICAL_CREAM | Freq: Every day | VAGINAL | 0 refills | Status: AC
Start: 2023-04-06 — End: 2023-04-12
  Filled 2023-04-05: qty 45, 6d supply, fill #0

## 2023-04-05 NOTE — Progress Notes (Signed)
Established Patient Office Visit  Subjective   Patient ID: Courtney Soto, female    DOB: 15-Jun-1986  Age: 37 y.o. MRN: [id number]  No chief complaint on file.   HPI Courtney Soto is  a 37 y/o female who has a history of varicose vein in left leg who presents for a follow-up visit. She was seen [facility] by Georgiana Spinner NP on 01/31/23. Recommend: laser ablation of the left great saphenous veins to eliminate the symptoms of pain and swelling of the lower extremities caused by the severe superficial venous reflux disease. She states that she's waiting for her [facility] financial application to be approved before scheduling procedure. She continues to wear compression stockings and elevating legs while sitting down. Currently, she states that she is having whitish minimal vaginal discharge, itching, intermittent dysuria  and pain during sexual intercoursethat , and symptoms started 1 week ago. She denies urinary frequency, urgency, fever, chills, flank and pelvic pain. She states that she has one sexual partner. Overall, she states that she's doing well and offers no further complaint.  Review of Systems  Constitutional: Negative.   Respiratory: Negative.    Cardiovascular: Negative.   Genitourinary:  Positive for dysuria. Negative for flank pain, frequency, hematuria and urgency.  Neurological: Negative.   Psychiatric/Behavioral: Negative.        Objective:     BP 107/74 (BP Location: Right Arm, Patient Position: Sitting, Cuff Size: Large)   Pulse 93   Wt 182 lb 1.6 oz (82.6 kg)   BMI 32.78 kg/m  BP Readings from Last 3 Encounters:  04/05/23 107/74  01/31/23 107/76  12/28/22 114/78   Wt Readings from Last 3 Encounters:  04/05/23 182 lb 1.6 oz (82.6 kg)  01/31/23 178 lb 6.4 oz (80.9 kg)  12/28/22 179 lb (81.2 kg)      Physical Exam HENT:     Head: Normocephalic and atraumatic.  Cardiovascular:     Rate and Rhythm: Normal rate and regular rhythm.      Pulses: Normal pulses.     Heart sounds: Normal heart sounds.  Pulmonary:     Effort: Pulmonary effort is normal.     Breath sounds: Normal breath sounds.  Abdominal:     General: Bowel sounds are normal. There is no distension.     Palpations: Abdomen is soft.     Tenderness: There is no right CVA tenderness or left CVA tenderness.  Musculoskeletal:        General: No swelling or tenderness. Normal range of motion.  Neurological:     General: No focal deficit present.     Mental Status: She is alert. Mental status is at baseline.  Psychiatric:        Mood and Affect: Mood normal.        Behavior: Behavior normal.        Thought Content: Thought content normal.        Judgment: Judgment normal.      No results found for any visits on 04/05/23.  Last CBC Lab Results  Component Value Date   WBC 9.7 02/11/2022   HGB 12.7 02/11/2022   HCT 38.0 02/11/2022   MCV 87.2 02/11/2022   MCH 29.1 02/11/2022   RDW 13.2 02/11/2022   PLT 268 02/11/2022   Last metabolic panel Lab Results  Component Value Date   GLUCOSE 80 05/31/2022   NA 141 05/31/2022   K 3.9 05/31/2022   CL 104 05/31/2022  CO2 21 05/31/2022   BUN 8 05/31/2022   CREATININE 0.65 05/31/2022   EGFR 118 05/31/2022   CALCIUM 9.7 05/31/2022   PROT 7.6 05/31/2022   ALBUMIN 4.8 05/31/2022   LABGLOB 2.8 05/31/2022   AGRATIO 1.7 05/31/2022   BILITOT 0.6 05/31/2022   ALKPHOS 110 05/31/2022   AST 15 05/31/2022   ALT 11 05/31/2022   ANIONGAP 9 02/11/2022   Last lipids Lab Results  Component Value Date   CHOL 142 05/31/2022   HDL 50 05/31/2022   LDLCALC 73 05/31/2022   TRIG 106 05/31/2022   CHOLHDL 2.8 05/31/2022   Last hemoglobin A1c Lab Results  Component Value Date   HGBA1C 5.0 05/31/2022   Last thyroid functions Lab Results  Component Value Date   TSH 1.150 09/28/2022      The ASCVD Risk score (Arnett DK, et al., 2019) failed to calculate for the following reasons:   The 2019 ASCVD risk score is  only valid for ages 47 to 61    Assessment & Plan:   1. Varicose veins of left lower extremity with pain - She was advised to follow up with [facility] financial application office to check status of her application so to schedule Laser treatment. She was encouraged to continue wearing compression stockings, and elevate legs while sitting down.  2. Health care maintenance - Routine labs will be checked - Comp Met (CMET); Future - CBC w/Diff; Future - Lipid panel; Future - HgB A1c; Future - Vitamin D (25 hydroxy); Future  3. Vaginal discharge - Unable to perform Swab test at the clinic, urine was checked, She was started on Monistat, educated on medication side effects and to notify clinic. She was advised to notify clinic with worsening symptoms. - UA/M w/rflx Culture, Routine; Future - UA/M w/rflx Culture, Routine - miconazole (MONISTAT 7) 2 % vaginal cream; Place 1 Applicatorful vaginally at bedtime for 6 days.  Dispense: 45 g; Refill: 0   Return in about 9 weeks (around 06/07/2023), or if symptoms worsen or fail to improve.    Ilynn Stauffer Trellis Paganini, NP

## 2023-04-06 ENCOUNTER — Other Ambulatory Visit: Payer: Self-pay

## 2023-04-06 LAB — UA/M W/RFLX CULTURE, ROUTINE
Bilirubin, UA: NEGATIVE
Glucose, UA: NEGATIVE
Ketones, UA: NEGATIVE
Leukocytes,UA: NEGATIVE
Nitrite, UA: NEGATIVE
Protein,UA: NEGATIVE
Specific Gravity, UA: 1.006 (ref 1.005–1.030)
Urobilinogen, Ur: 0.2 mg/dL (ref 0.2–1.0)
pH, UA: 7.5 (ref 5.0–7.5)

## 2023-04-06 LAB — MICROSCOPIC EXAMINATION
Bacteria, UA: NONE SEEN
Casts: NONE SEEN /lpf
Epithelial Cells (non renal): NONE SEEN /hpf (ref 0–10)
WBC, UA: NONE SEEN /hpf (ref 0–5)

## 2023-04-10 ENCOUNTER — Other Ambulatory Visit: Payer: Self-pay

## 2023-04-18 IMAGING — MR MR LUMBAR SPINE W/O CM
5 series · 31 of 48 positions shown · non-contrast
Comparison: None.

CLINICAL DATA: 35-year-old female with acute onset low back pain
?heard a pop? when holding daughter.

EXAM:
MRI LUMBAR SPINE WITHOUT CONTRAST
TECHNIQUE: Multiplanar, multisequence MR imaging of the lumbar spine was
performed. No intravenous contrast was administered.

[Series 5: T2 · sagittal · 4.0mm · 0.81mm/px · 6 of 14 slices shown (1 of 2)]
[im 1/14]
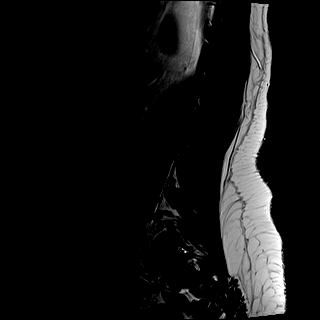
[im 3/14]
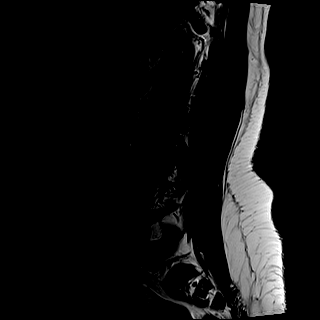
[im 6/14]
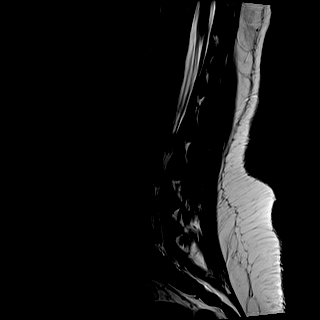
[im 8/14]
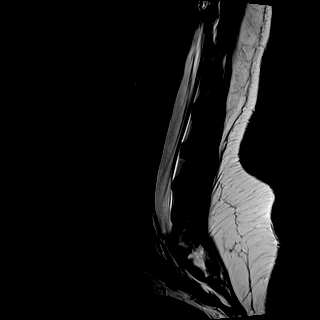
[im 11/14]
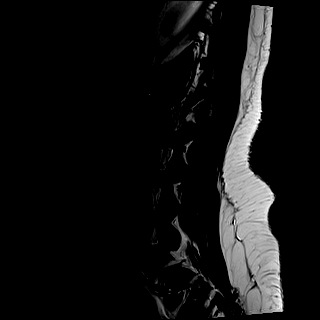
[im 14/14]
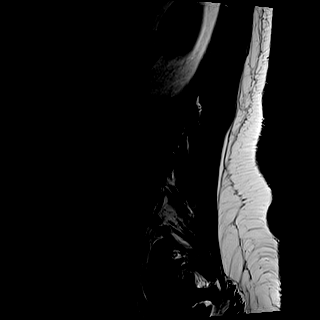

[Series 6: T1 · sagittal · 4.0mm · 0.81mm/px · 6 of 14 slices shown (1 of 2)]
[im 1/14]
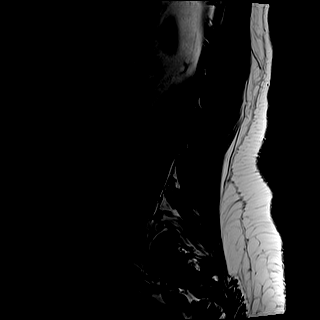
[im 3/14]
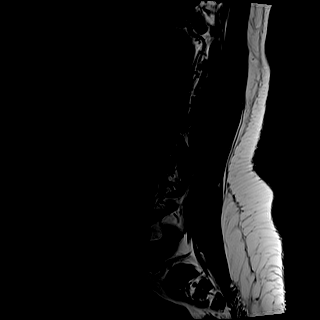
[im 6/14]
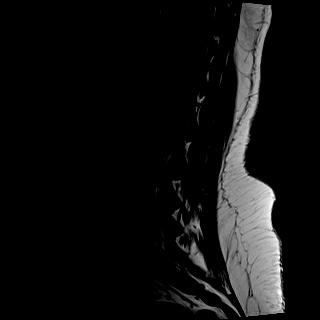
[im 8/14]
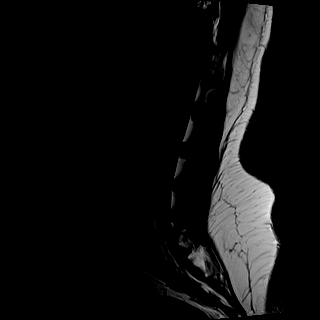
[im 11/14]
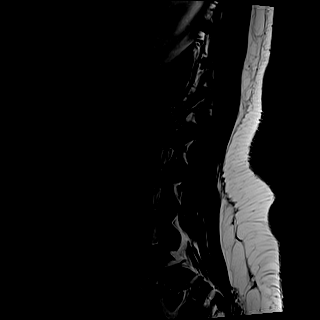
[im 14/14]
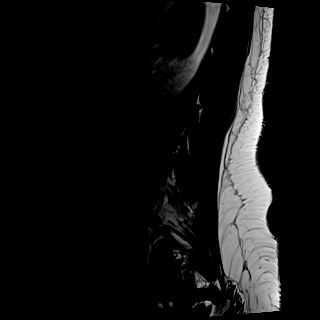

[Series 7: STIR · sagittal · 4.0mm · 0.41mm/px · 1 of 14 slices shown]
[im 1/14]
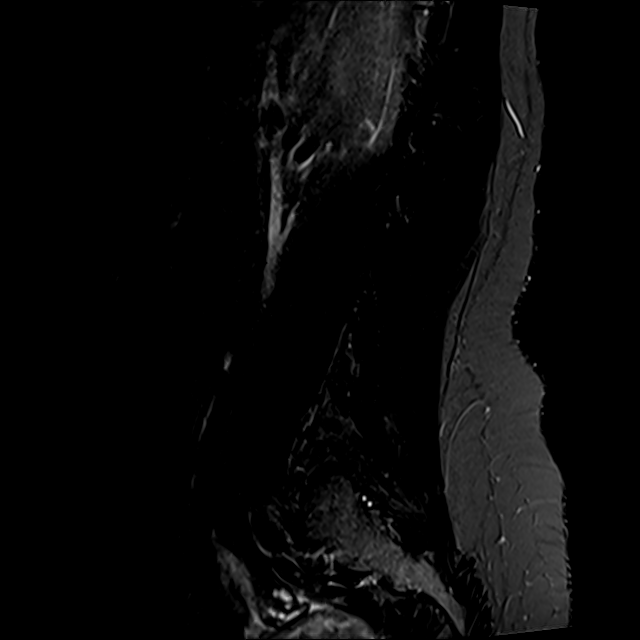

[Series 8: T2 · axial · 4.0mm · 0.78mm/px · z∈[-58,+157]mm · 9 of 35 slices shown (2 of 2)]
[im 1/35]
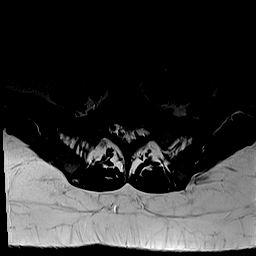
[im 5/35]
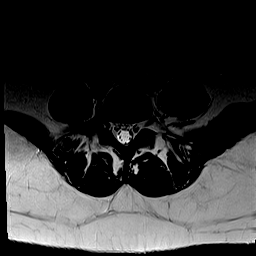
[im 10/35]
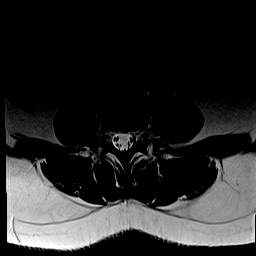
[im 15/35]
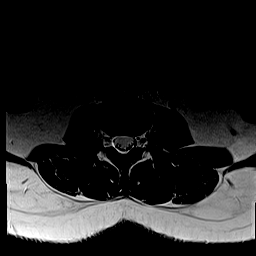
[im 18/35]
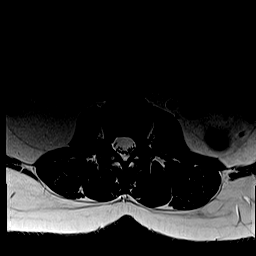
[im 20/35]
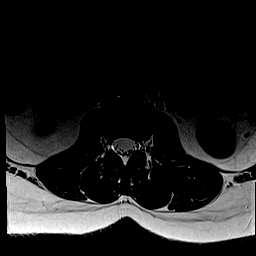
[im 25/35]
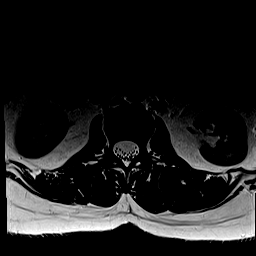
[im 30/35]
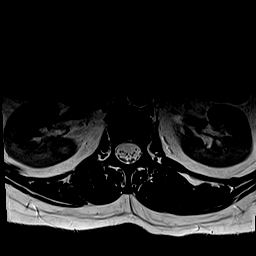
[im 35/35]
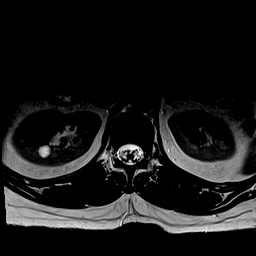

[Series 9: T1 · axial · 4.0mm · 0.39mm/px · z∈[-58,+157]mm · 9 of 35 slices shown (2 of 2)]
[im 1/35]
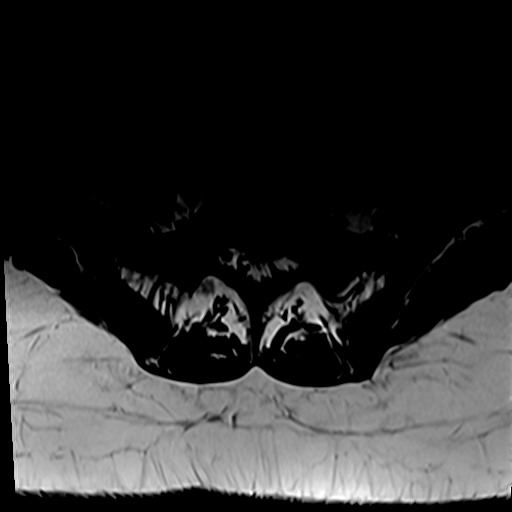
[im 5/35]
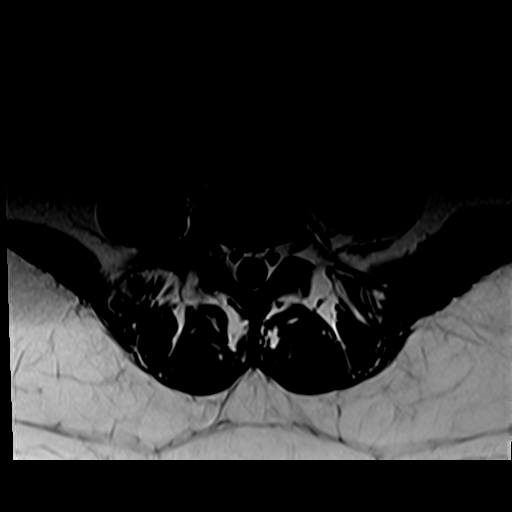
[im 10/35]
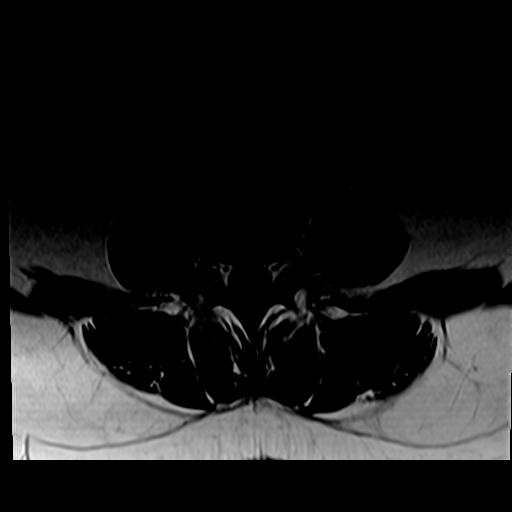
[im 15/35]
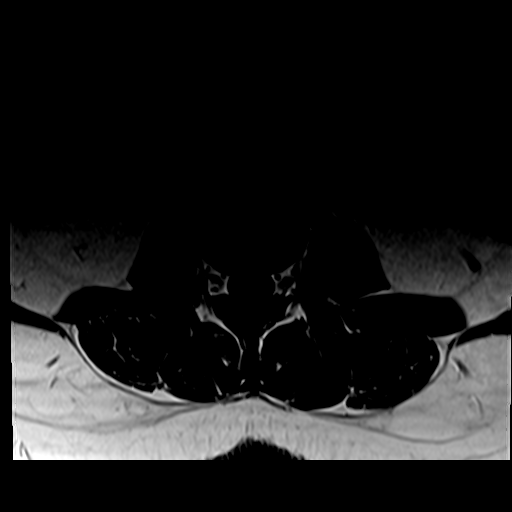
[im 18/35]
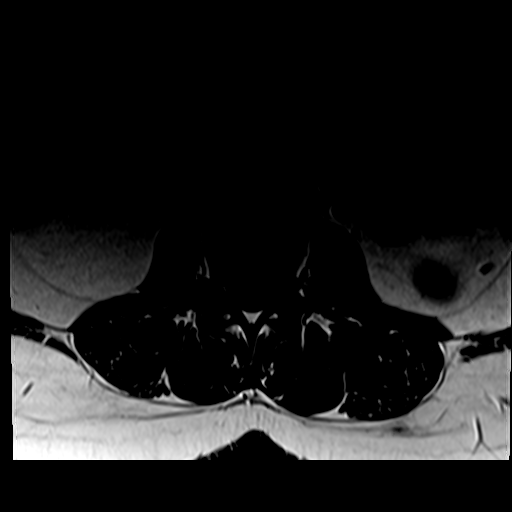
[im 20/35]
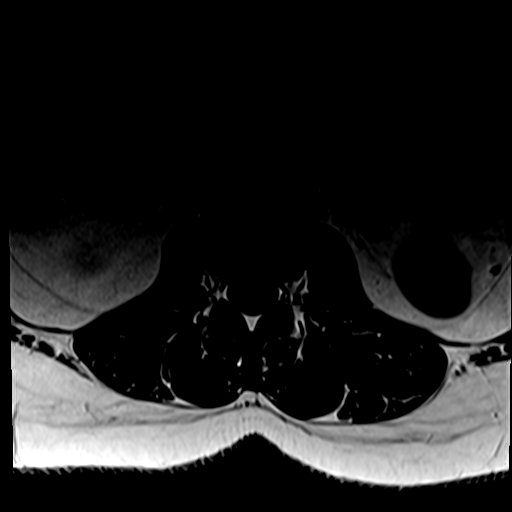
[im 25/35]
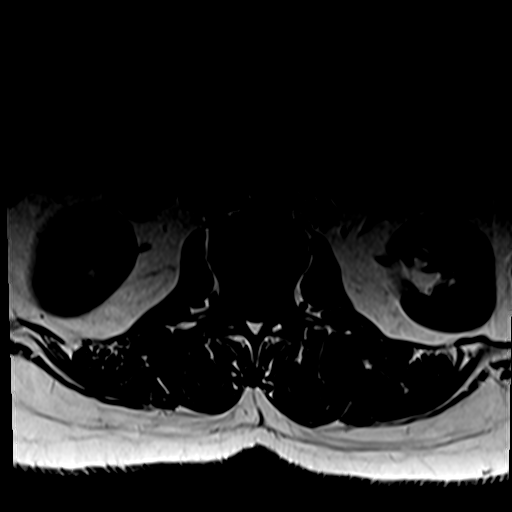
[im 30/35]
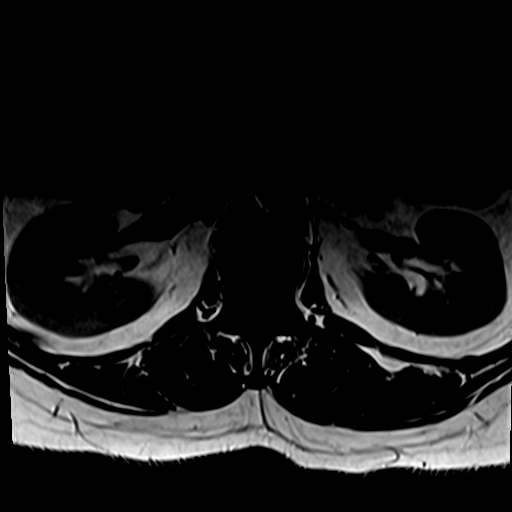
[im 35/35]
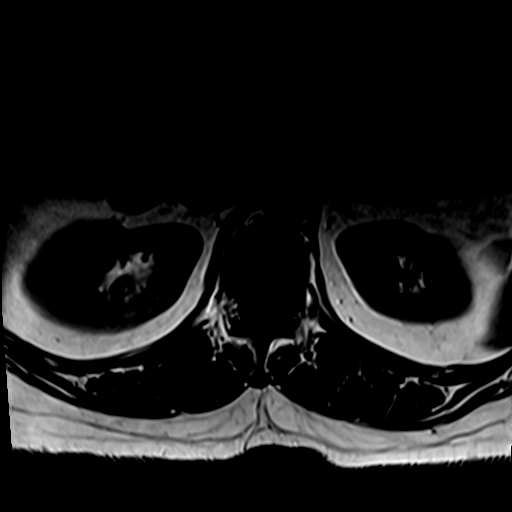

[31 of 48 positions shown; findings below may reference images not displayed]

FINDINGS: Segmentation: Lumbar segmentation appears to be normal and will be
designated as such for this report.

Alignment:  Straightening of lumbar lordosis. No spondylolisthesis.

Vertebrae: Generalized decreased T1 marrow signal in the visible
spine and pelvis, but no associated STIR hyperintensity or marrow
edema. No discrete bone lesion. Visible pelvis appears intact.

Conus medullaris and cauda equina: Conus extends to the T12 level.
No lower spinal cord or conus signal abnormality. Capacious spinal
canal. Normal cauda equina nerve roots.

Paraspinal and other soft tissues: Negative; small benign appearing
right renal cyst (no imaging follow-up recommended).

Disc levels:

T11-T12 through L2-L3: Negative.

L3-L4: Negative disc. Mild facet hypertrophy. Capacious spinal canal
and no stenosis.

L4-L5: Negative.

L5-S1: Disc desiccation. Small to moderate sized central disc
protrusion (series 9, image 33) in proximity to the descending S1
nerve roots and thecal sac although underlying epidural lipomatosis
here. No nerve root impingement or significant spinal stenosis. Mild
facet hypertrophy. No foraminal involvement or stenosis.
IMPRESSION: 1. Isolated lumbar disc degeneration at L5-S1 with a small to
moderate central disc herniation. Underlying capacious spinal canal
with no spinal stenosis, although this disc might be a source of S1
radiculitis.

2. Nonspecific generalized decreased T1 marrow signal in the visible
spine and pelvis. This is nonspecific, query Anemia. Although this
can be caused by marrow infiltrative processes, other common causes
include smoking and obesity.

## 2023-06-07 ENCOUNTER — Ambulatory Visit: Payer: Self-pay

## 2023-06-07 VITALS — BP 103/72 | HR 92 | Temp 98.5°F | Resp 18 | Wt 184.4 lb

## 2023-06-07 DIAGNOSIS — B351 Tinea unguium: Secondary | ICD-10-CM

## 2023-06-07 DIAGNOSIS — I83812 Varicose veins of left lower extremities with pain: Secondary | ICD-10-CM

## 2023-06-07 NOTE — Progress Notes (Signed)
Established Patient Office Visit  Subjective   Patient ID: Courtney Soto, female    DOB: 04-30-1986  Age: 37 y.o. MRN: [id number]  No chief complaint on file.   HPI Courtney Soto is  a 37 y/o female who has a history of varicose vein in left leg who presents for a follow-up visit. She [facility] on 01/31/23 and recommend for laser ablation of the left great saphenous veins. She states that her [facility] financial application is in process. She will schedule the operation when financial application is approved. She reported pain on left lower extremity, rated 4/10 with walking, described as pressure pain, and 10/10 when she is sleeping. She denies falls. She takes ibuprofen as needed with pain reduced. She reports her fungal nail on right second finger fallen off  four days ago. She uses cooking vinegar to soak the finger. she states that her symptoms of whitish minimal vaginal discharge and itching have been resolved. Overall, she states that she's doing well and offers no further complaint.  Review of Systems  Constitutional: Negative.   HENT: Negative.    Eyes: Negative.   Respiratory: Negative.    Cardiovascular:  Positive for leg swelling.       Complaints left lower extremity pain.  Gastrointestinal: Negative.   Genitourinary: Negative.   Musculoskeletal: Negative.   Skin: Negative.   Neurological: Negative.   Endo/Heme/Allergies: Negative.   Psychiatric/Behavioral: Negative.        Objective:     BP 103/72 (BP Location: Right Arm, Patient Position: Sitting, Cuff Size: Large)   Pulse 92   Temp 98.5 F (36.9 C) (Oral)   Resp 18   Wt 184 lb 6.4 oz (83.6 kg)   SpO2 98%   BMI 33.19 kg/m  BP Readings from Last 3 Encounters:  06/07/23 103/72  04/05/23 107/74  01/31/23 107/76   Wt Readings from Last 3 Encounters:  06/07/23 184 lb 6.4 oz (83.6 kg)  04/05/23 182 lb 1.6 oz (82.6 kg)  01/31/23 178 lb 6.4 oz (80.9 kg)    Physical  Exam Constitutional:      Appearance: Normal appearance.  HENT:     Head: Normocephalic.     Right Ear: Tympanic membrane normal.     Left Ear: Tympanic membrane normal.     Nose: Nose normal.     Mouth/Throat:     Mouth: Mucous membranes are moist.  Eyes:     Extraocular Movements: Extraocular movements intact.     Pupils: Pupils are equal, round, and reactive to light.  Cardiovascular:     Rate and Rhythm: Normal rate and regular rhythm.     Pulses: Normal pulses.     Heart sounds:     No friction rub.  Pulmonary:     Effort: Pulmonary effort is normal.     Breath sounds: Normal breath sounds.  Abdominal:     General: Abdomen is flat.     Palpations: Abdomen is soft.  Musculoskeletal:        General: Swelling present.     Cervical back: Normal range of motion.     Comments: Left lower extremity swelling due to varicose vein.  Skin:    General: Skin is warm.     Capillary Refill: Capillary refill takes less than 2 seconds.     Comments: Fungal nail on right second finger  Neurological:     General: No focal deficit present.     Mental Status: She  is alert and oriented to person, place, and time.  Psychiatric:        Mood and Affect: Mood normal.        Behavior: Behavior normal.    No results found for any visits on 06/07/23.  Last CBC Lab Results  Component Value Date   WBC 9.7 02/11/2022   HGB 12.7 02/11/2022   HCT 38.0 02/11/2022   MCV 87.2 02/11/2022   MCH 29.1 02/11/2022   RDW 13.2 02/11/2022   PLT 268 02/11/2022   Last metabolic panel Lab Results  Component Value Date   GLUCOSE 80 05/31/2022   NA 141 05/31/2022   K 3.9 05/31/2022   CL 104 05/31/2022   CO2 21 05/31/2022   BUN 8 05/31/2022   CREATININE 0.65 05/31/2022   EGFR 118 05/31/2022   CALCIUM 9.7 05/31/2022   PROT 7.6 05/31/2022   ALBUMIN 4.8 05/31/2022   LABGLOB 2.8 05/31/2022   AGRATIO 1.7 05/31/2022   BILITOT 0.6 05/31/2022   ALKPHOS 110 05/31/2022   AST 15 05/31/2022   ALT 11  05/31/2022   ANIONGAP 9 02/11/2022   Last lipids Lab Results  Component Value Date   CHOL 142 05/31/2022   HDL 50 05/31/2022   LDLCALC 73 05/31/2022   TRIG 106 05/31/2022   CHOLHDL 2.8 05/31/2022   Last hemoglobin A1c Lab Results  Component Value Date   HGBA1C 5.0 05/31/2022   Last thyroid functions Lab Results  Component Value Date   TSH 1.150 09/28/2022   Last vitamin D No results found for: "25OHVITD2", "25OHVITD3", "VD25OH" Last vitamin B12 and Folate No results found for: "VITAMINB12", "FOLATE"    The ASCVD Risk score (Arnett DK, et al., 2019) failed to calculate for the following reasons:   The 2019 ASCVD risk score is only valid for ages 2 to 37    Assessment & Plan:   1. Fungal infection of nail Right second finger nail bed clean and dry. She was instructed to use cooking vinegar socking finger nail bed twice a day, 15 minutes for each time. She was instructed to call clinic if symptom getting worse. Will follow up in  3 months clinic.  2. Varicose veins of left lower extremity with pain  She was encourage to wear compression stockings and elevating legs while sitting down. She will have laser ablation of the left great saphenous veins when [place] financial application is approved.   Return in about 13 weeks (around 09/06/2023), or if symptoms worsen or fail to improve.    Odette Fraction, NP

## 2023-06-07 NOTE — Patient Instructions (Signed)
Infeccin por hongos en las uas Fungal Nail Infection La infeccin por hongos en las uas es una infeccin frecuente de las uas de los pies o de las manos. Este trastorno [employer] uas de los pies con ms frecuencia que las uas de las manos. Generalmente afecta al dedo gordo del pie. Ms de Neomia Dear ua puede infectarse. Esta afeccin puede transmitirse de [place] persona a otra (es contagiosa). Cules son las causas? La causa de esta afeccin es un hongo, como las levaduras o Lawyer. Son varios los tipos de hongos que pueden causar la infeccin. Estos hongos son frecuentes en las zonas hmedas y clidas. Si las manos o los pies entran en contacto con los hongos, se pueden introducir en una grieta de las uas de las manos o de los pies o en la piel de alrededor y Retail banker una infeccin. Qu incrementa el riesgo? Los siguientes factores pueden hacer que sea ms propenso a Aeronautical engineer afeccin: Ser una persona de edad avanzada. Tener ciertas afecciones, por ejemplo: Pie de atleta. Diabetes. Mala circulacin. Debilitamiento del sistema de defensa del organismo (sistema inmunitario). Caminar descalzo en zonas donde proliferan hongos, como duchas o vestuarios. Usar zapatos y calcetines que [employer]. Tener una ua lastimada o haberse sometido a una ciruga de uas recientemente. Cules son los signos o sntomas? Los sntomas de esta afeccin incluyen: Una mancha plida sobre la ua. Engrosamiento de la ua. Una ua que se torna amarilla, marrn o blanca. Bordes de las uas rugosos o quebradizos. Una ua que se ha desprendido del lecho ungueal. Cmo se diagnostica? Esta afeccin se diagnostica mediante un examen fsico. El mdico podr tomar una muestra de la ua para examinarla y Engineer, manufacturing si tiene hongos. Cmo se trata? No es necesario realizar tratamiento si la infeccin es leve. Si tiene [employer] uas, el tratamiento puede incluir lo  siguiente: Antimicticos que se toman por boca (va oral). Deber tomar los medicamentos durante algunas semanas o meses y no ver los resultados hasta despus de un [place]. Estos medicamentos pueden tener efectos secundarios. Consulte al [employer] a los que debe estar atento. Cremas o esmaltes de uas antimicticos. Se pueden usar junto con los medicamentos antimicticos que se administran por va oral. Tratamiento lser de las uas. Ciruga para extirpar la ua. Esto puede ser [employer] casos ms graves de infecciones. La infeccin puede tardar un largo tiempo en desaparecer, habitualmente hasta un ao. Adems, la infeccin puede regresar. Siga estas indicaciones en su casa: Medicamentos Use o aplquese los medicamentos de venta libre y los recetados solamente como se lo haya indicado el mdico. Consulte al mdico sobre el uso de pomadas mentoladas para las uas de [place]. Cuidado de las uas Crtese las uas con Psychologist, clinical. Lvese y squese las manos y los pies todos [place]. Mantener los pies secos. Para hacer esto: Use calcetines absorbentes y cmbiese los calcetines con frecuencia. Use un tipo de calzado que permita que el aire [place], como sandalias o zapatillas de lona. Deseche los zapatos viejos. Si va al saln de esttica de uas, asegrese de elegir uno en el que se usen instrumentos limpios. Aplquese polvo antimictico en los pies y en los zapatos. Indicaciones generales No comparta elementos personales como toallas o cortauas. No camine descalzo en duchas o vestuarios. Use guantes de goma si est trabajando con sus manos en lugares mojados. Concurra a todas las visitas de seguimiento. Esto es importante. Comunquese con un  mdico si: Tiene enrojecimiento, dolor o pus cerca de la ua del pie o de la [place]. La infeccin no mejora o la infeccin empeora despus de varios meses. Tiene ms problemas de circulacin cerca de la ua del pie o de la  [place]. La ua se pone de color marrn o negro y ese color se extiende a la piel circundante. Resumen La infeccin por hongos en las uas es una infeccin frecuente de las uas de los pies o de las manos. No es necesario realizar tratamiento si la infeccin es leve. Si tiene [employer] uas, el tratamiento puede incluir la administracin de medicamentos por va oral y la aplicacin de un medicamento en las uas. La infeccin puede tardar un largo tiempo en desaparecer, habitualmente hasta un ao. Adems, la infeccin puede regresar. Use o aplquese los medicamentos de venta libre y los recetados solamente como se lo haya indicado el mdico. Esta informacin no tiene Theme park manager el consejo del mdico. Asegrese de hacerle al mdico cualquier pregunta que tenga. Document Revised: 03/01/2021 Document Reviewed: 03/01/2021 Elsevier Patient Education  2024 Elsevier Inc. Venas varicosas Varicose Veins Las venas varicosas son venas que se han agrandado y [place], y se han tornado sinuosas. Suelen aparecer en las piernas. Cules son las causas? La causa de esta afeccin es el dao en las vlvulas de la vena. Estas vlvulas ayudan a que la sangre regrese al [employer]. Cuando estn daadas y dejan de funcionar correctamente, la sangre puede ir en direccin inversa y Engineer, maintenance a las venas cerca de la piel, lo que hace que las venas se agranden y se vean sinuosas. La afeccin puede surgir por cualquier factor que haga que la sangre retorne, como un [place], una actividad que obligue a estar de pie de forma prolongada o la obesidad. Qu incrementa el riesgo? Los siguientes factores pueden hacer que sea ms propenso a Clinical cytogeneticist afeccin: Personal assistant de Veterinary surgeon. Estar embarazada. Tener sobrepeso. Fumar. Haber tenido una trombosis venosa profunda previa o tener un trastorno trombtico. Envejecimiento. El riesgo aumenta con la edad. Tener una afeccin llamada sndrome de  [place]. Cules son los signos o sntomas? Los sntomas de esta afeccin incluyen: Venas abultadas, sinuosas y [place]. Sensacin de [employer]. Estos sntomas pueden empeorar hacia el final del da. Dolor en las piernas. Estos sntomas pueden empeorar hacia el final del da. Hinchazn en la pierna. Cambios en el color de la piel que est [employer]. La hinchazn o Chief Technology Officer en las piernas pueden limitar sus [place]. Los sntomas pueden empeorar al estar sentado o de pie durante largos perodos. Cmo se diagnostica? Esta afeccin se puede diagnosticar en funcin de lo siguiente: Sus sntomas, antecedentes familiares, niveles de [place] y [place] de vida. Un examen fsico. Tambin pueden hacerle estudios como una ecografa o radiografa. Cmo se trata? El tratamiento de esta afeccin puede incluir lo siguiente: Careers information officer sentado o de pie en la misma posicin durante mucho tiempo. Usar medias de compresin. Estas medias ayudan a Transport planner formacin de cogulos de [place] y a reducir la hinchazn de las piernas. Levantar (elevar) las piernas al descansar. Bajar de [place]. Hacer actividad fsica con regularidad. Si tiene sntomas persistentes o desea mejorar el aspecto de las venas varicosas, puede optar por un procedimiento para anular dichas venas o extraerlas. Entre los tratamientos no quirrgicos para anular las venas, se incluyen los siguientes: Regulatory affairs officer. En este tratamiento, se inyecta una solucin en la vena para anularla. Gerlean Ren  con lser. La vena se calienta con un lser para anularla. Ablacin venosa por radiofrecuencia. Se [place] una corriente elctrica que se produce mediante ondas de radio para anular la vena. Entre los tratamientos quirrgicos para extraer las venas, se incluyen los siguientes: [place]. En este procedimiento, las venas se extraen a travs de pequeas incisiones que se realizan por encima de las venas. Ligadura  venosa y varicectoma. En este procedimiento, se realizan incisiones por encima de las venas. Luego, las venas se extraen despus de atarse (ligarse) con puntos (suturas). Siga estas instrucciones en su casa: Medicamentos Tome los medicamentos de venta libre y los recetados solamente como se lo haya indicado el mdico. Si le recetaron un antibitico, tmelo como se lo haya indicado el mdico. No deje de usar el antibitico aunque comience a Actor. Actividad Camine todo lo que pueda. Caminar aumenta el flujo sanguneo. Esto ayuda a que la sangre regrese al corazn y Chief Executive Officer la presin en las venas. No permanezca de pie o sentado en una misma posicin [employer]Ex. No se siente con las piernas cruzadas. Evite estar sentado durante largos perodos sin moverse. Levntese y camine un poco cada 1 a 2 horas. Esto es importante para mejorar el flujo sanguneo y la respiracin. Pida ayuda si se siente dbil o inestable. Retome sus actividades normales segn lo indicado por el mdico. Pregntele al mdico qu actividades son seguras para usted. Haga ejercicio como se lo haya indicado el mdico. Instrucciones generales  Siga las instrucciones del mdico en lo que respecta a la dieta. De noche, eleve las piernas a una altura superior al nivel del corazn. Si se corta en la piel que est por encima de una vena varicosa y esta sangra: Recustese con la pierna levantada. Coloque un pao limpio sobre el corte y presione firmemente sobre l hasta que el sangrado se [place]. Aplique una venda (vendaje) sobre el corte. Beba suficiente lquido como para Pharmacologist la orina de color amarillo plido. No consuma ningn producto que contenga nicotina o tabaco. Estos productos incluyen cigarrillos, tabaco para Theatre manager y aparatos de vapeo, como los Administrator, Civil Service. Si necesita ayuda para dejar de fumar, consulte al [employer]. Use medias de compresin como se lo haya indicado su mdico. No use otra  clase de vestimenta ajustada alrededor de las piernas, la pelvis o la cintura. Concurra a todas las visitas de seguimiento. Esto es importante. Comunquese con un mdico si: La piel alrededor de las venas varicosas empieza a Lobbyist. Siente ms dolor o tiene enrojecimiento, sensibilidad o hinchazn dura sobre la vena. Est incmodo debido al dolor. Se corta en la piel de encima de una vena varicosa y no deja de Geophysicist/field seismologist. Solicite ayuda de inmediato si: Midwife. Tiene dificultad para respirar. Siente dolor intenso en la pierna. Resumen Las venas varicosas son venas que se han agrandado y [place], y se han tornado sinuosas. Suelen aparecer en las piernas. La causa de esta afeccin es el dao en las vlvulas de la vena. Estas vlvulas ayudan a que la sangre regrese al [employer]. El tratamiento de esta afeccin incluye hacer movimientos frecuentes, usar medias de compresin, perder peso y Radio producer ejercicios de forma regular. En algunos casos, se realizan procedimientos que anulan o extraen las venas. Los tratamientos no quirrgicos para cerrar las venas incluyen escleroterapia, terapia lser y ablacin venosa por radiofrecuencia. Esta informacin no tiene Theme park manager el consejo del mdico. Asegrese de hacerle al mdico cualquier pregunta que tenga. Document Revised: 05/02/2021 Document  Reviewed: 05/02/2021 Elsevier Patient Education  2024 [employer].

## 2023-09-06 ENCOUNTER — Ambulatory Visit: Payer: Self-pay | Admitting: Gerontology

## 2023-09-06 VITALS — BP 100/70 | HR 73 | Resp 16 | Wt 184.9 lb

## 2023-09-06 DIAGNOSIS — I83812 Varicose veins of left lower extremities with pain: Secondary | ICD-10-CM

## 2023-09-06 DIAGNOSIS — B351 Tinea unguium: Secondary | ICD-10-CM

## 2023-09-06 NOTE — Progress Notes (Signed)
Established Patient Office Visit  Subjective   Patient ID: Courtney Soto, female    DOB: 1986/04/07  Age: 37 y.o. MRN: [id number]  No chief complaint on file.   HPI  Courtney Soto is  a 37 y/o female who has a history of left lower extremity varicose vein who presents for a follow-up visit. She states that she continues to experience constant pressure to the varicose vein to her left leg. She denies pain, erythema and claudication. She states that she has been taking ibuprofen for the inflammation. She states that she has been wearing compression stockings. She reports completing [facility] financial application has not receieived a call. She states that the fungal infection that is located on the second finger of her right hand is improving. Overall she is doing well and offers no further complaints at this time.    Review of Systems  Constitutional: Negative.   HENT: Negative.    Eyes: Negative.   Respiratory: Negative.    Cardiovascular: Negative.   Gastrointestinal: Negative.   Genitourinary: Negative.   Musculoskeletal: Negative.   Skin: Negative.   Neurological: Negative.   Endo/Heme/Allergies: Negative.   Psychiatric/Behavioral: Negative.        Objective:     BP 100/70 (BP Location: Left Arm, Patient Position: Sitting, Cuff Size: Large)   Pulse 73   Resp 16   Wt 184 lb 14.4 oz (83.9 kg)   BMI 33.28 kg/m  BP Readings from Last 3 Encounters:  09/06/23 100/70  06/07/23 103/72  04/05/23 107/74   Wt Readings from Last 3 Encounters:  09/06/23 184 lb 14.4 oz (83.9 kg)  06/07/23 184 lb 6.4 oz (83.6 kg)  04/05/23 182 lb 1.6 oz (82.6 kg)      Physical Exam Constitutional:      Appearance: Normal appearance. She is normal weight.  HENT:     Head: Normocephalic and atraumatic.     Nose: Nose normal.     Mouth/Throat:     Mouth: Mucous membranes are moist.     Pharynx: Oropharynx is clear.  Eyes:     Conjunctiva/sclera: Conjunctivae normal.     Pupils:  Pupils are equal, round, and reactive to light.  Cardiovascular:     Rate and Rhythm: Normal rate and regular rhythm.     Pulses: Normal pulses.     Heart sounds: Normal heart sounds.  Pulmonary:     Effort: Pulmonary effort is normal.     Breath sounds: Normal breath sounds.  Abdominal:     General: Abdomen is flat. Bowel sounds are normal.  Musculoskeletal:        General: Normal range of motion.     Cervical back: Normal range of motion.  Skin:    General: Skin is warm and dry.  Neurological:     General: No focal deficit present.     Mental Status: She is alert and oriented to person, place, and time. Mental status is at baseline.  Psychiatric:        Mood and Affect: Mood normal.        Behavior: Behavior normal.        Thought Content: Thought content normal.        Judgment: Judgment normal.      No results found for any visits on 09/06/23.  Last CBC Lab Results  Component Value Date   WBC 9.7 02/11/2022   HGB 12.7 02/11/2022   HCT 38.0 02/11/2022   MCV 87.2 02/11/2022   MCH 29.1  02/11/2022   RDW 13.2 02/11/2022   PLT 268 02/11/2022   Last metabolic panel Lab Results  Component Value Date   GLUCOSE 80 05/31/2022   NA 141 05/31/2022   K 3.9 05/31/2022   CL 104 05/31/2022   CO2 21 05/31/2022   BUN 8 05/31/2022   CREATININE 0.65 05/31/2022   EGFR 118 05/31/2022   CALCIUM 9.7 05/31/2022   PROT 7.6 05/31/2022   ALBUMIN 4.8 05/31/2022   LABGLOB 2.8 05/31/2022   AGRATIO 1.7 05/31/2022   BILITOT 0.6 05/31/2022   ALKPHOS 110 05/31/2022   AST 15 05/31/2022   ALT 11 05/31/2022   ANIONGAP 9 02/11/2022   Last lipids Lab Results  Component Value Date   CHOL 142 05/31/2022   HDL 50 05/31/2022   LDLCALC 73 05/31/2022   TRIG 106 05/31/2022   CHOLHDL 2.8 05/31/2022   Last hemoglobin A1c Lab Results  Component Value Date   HGBA1C 5.0 05/31/2022   Last thyroid functions Lab Results  Component Value Date   TSH 1.150 09/28/2022   Last vitamin D No  results found for: "25OHVITD2", "25OHVITD3", "VD25OH" Last vitamin B12 and Folate No results found for: "VITAMINB12", "FOLATE"    The ASCVD Risk score (Arnett DK, et al., 2019) failed to calculate for the following reasons:   The 2019 ASCVD risk score is only valid for ages 60 to 13    Assessment & Plan:  1. Varicose veins of left lower extremity with pain - She was advised to continue to wear compression stockings and elevate the lower extremity. Awaiting approval on [facility] Application.    2. Fungal infection of nail - Right hand second nail bed clean and dry. She was instructed to use gloves when using harsh chemicals and to keep finger dry. She was advised to notify clinic for worsening symptoms.    Return in about 4 weeks (around 10/04/2023), or if symptoms worsen or fail to improve.    Nilsa Nutting, FNP student

## 2023-09-06 NOTE — Patient Instructions (Signed)
Venas varicosas Varicose Veins Las venas varicosas son venas que se han agrandado y [place], y se han tornado sinuosas. Suelen aparecer en las piernas. Cules son las causas? La causa de esta afeccin es el dao en las vlvulas de la vena. Estas vlvulas ayudan a que la sangre regrese al [employer]. Cuando estn daadas y dejan de funcionar correctamente, la sangre puede ir en direccin inversa y Engineer, maintenance a las venas cerca de la piel, lo que hace que las venas se agranden y se vean sinuosas. La afeccin puede surgir por cualquier factor que haga que la sangre retorne, como un [place], una actividad que obligue a estar de pie de forma prolongada o la obesidad. Qu incrementa el riesgo? Los siguientes factores pueden hacer que sea ms propenso a Clinical cytogeneticist afeccin: Personal assistant de Veterinary surgeon. Estar embarazada. Tener sobrepeso. Fumar. Haber tenido una trombosis venosa profunda previa o tener un trastorno trombtico. Envejecimiento. El riesgo aumenta con la edad. Tener una afeccin llamada sndrome de [place]. Cules son los signos o sntomas? Los sntomas de esta afeccin incluyen: Venas abultadas, sinuosas y [place]. Sensacin de [employer]. Estos sntomas pueden empeorar hacia el final del da. Dolor en las piernas. Estos sntomas pueden empeorar hacia el final del da. Hinchazn en la pierna. Cambios en el color de la piel que est [employer]. La hinchazn o Chief Technology Officer en las piernas pueden limitar sus [place]. Los sntomas pueden empeorar al estar sentado o de pie durante largos perodos. Cmo se diagnostica? Esta afeccin se puede diagnosticar en funcin de lo siguiente: Sus sntomas, antecedentes familiares, niveles de [place] y [place] de vida. Un examen fsico. Tambin pueden hacerle estudios como una ecografa o radiografa. Cmo se trata? El tratamiento de esta afeccin puede incluir lo siguiente: Careers information officer sentado o de pie en la  misma posicin durante mucho tiempo. Usar medias de compresin. Estas medias ayudan a Transport planner formacin de cogulos de [place] y a reducir la hinchazn de las piernas. Levantar (elevar) las piernas al descansar. Bajar de [place]. Hacer actividad fsica con regularidad. Si tiene sntomas persistentes o desea mejorar el aspecto de las venas varicosas, puede optar por un procedimiento para anular dichas venas o extraerlas. Entre los tratamientos no quirrgicos para anular las venas, se incluyen los siguientes: Regulatory affairs officer. En este tratamiento, se inyecta una solucin en la vena para anularla. Tratamiento con lser. La vena se calienta con un lser para anularla. Ablacin venosa por radiofrecuencia. Se [place] una corriente elctrica que se produce mediante ondas de radio para anular la vena. Entre los tratamientos quirrgicos para extraer las venas, se incluyen los siguientes: [place]. En este procedimiento, las venas se extraen a travs de pequeas incisiones que se realizan por encima de las venas. Ligadura venosa y varicectoma. En este procedimiento, se realizan incisiones por encima de las venas. Luego, las venas se extraen despus de atarse (ligarse) con puntos (suturas). Siga estas instrucciones en su casa: Medicamentos Tome los medicamentos de venta libre y los recetados solamente como se lo haya indicado el mdico. Si le recetaron un antibitico, tmelo como se lo haya indicado el mdico. No deje de usar el antibitico aunque comience a Actor. Actividad Camine todo lo que pueda. Caminar aumenta el flujo sanguneo. Esto ayuda a que la sangre regrese al corazn y Chief Executive Officer la presin en las venas. No permanezca de pie o sentado en una misma posicin [employer]. No se siente con las piernas cruzadas. Evite estar sentado durante largos perodos  sin moverse. Levntese y camine un poco cada 1 a 2 horas. Esto es importante para mejorar el flujo sanguneo y la respiracin. Pida  ayuda si se siente dbil o inestable. Retome sus actividades normales segn lo indicado por el mdico. Pregntele al mdico qu actividades son seguras para usted. Haga ejercicio como se lo haya indicado el mdico. Instrucciones generales  Siga las instrucciones del mdico en lo que respecta a la dieta. De noche, eleve las piernas a una altura superior al nivel del corazn. Si se corta en la piel que est por encima de una vena varicosa y esta sangra: Recustese con la pierna levantada. Coloque un pao limpio sobre el corte y presione firmemente sobre l hasta que el sangrado se [place]. Aplique una venda (vendaje) sobre el corte. Beba suficiente lquido como para Pharmacologist la orina de color amarillo plido. No consuma ningn producto que contenga nicotina o tabaco. Estos productos incluyen cigarrillos, tabaco para Theatre manager y aparatos de vapeo, como los Administrator, Civil Service. Si necesita ayuda para dejar de fumar, consulte al [employer]. Use medias de compresin como se lo haya indicado su mdico. No use otra clase de vestimenta ajustada alrededor de las piernas, la pelvis o la cintura. Concurra a todas las visitas de seguimiento. Esto es importante. Comunquese con un mdico si: La piel alrededor de las venas varicosas empieza a Lobbyist. Siente ms dolor o tiene enrojecimiento, sensibilidad o hinchazn dura sobre la vena. Est incmodo debido al dolor. Se corta en la piel de encima de una vena varicosa y no deja de Geophysicist/field seismologist. Solicite ayuda de inmediato si: Midwife. Tiene dificultad para respirar. Siente dolor intenso en la pierna. Resumen Las venas varicosas son venas que se han agrandado y [place], y se han tornado sinuosas. Suelen aparecer en las piernas. La causa de esta afeccin es el dao en las vlvulas de la vena. Estas vlvulas ayudan a que la sangre regrese al [employer]. El tratamiento de esta afeccin incluye hacer movimientos frecuentes, usar medias de compresin,  perder peso y Radio producer ejercicios de forma regular. En algunos casos, se realizan procedimientos que anulan o extraen las venas. Los tratamientos no quirrgicos para cerrar las venas incluyen escleroterapia, terapia lser y ablacin venosa por radiofrecuencia. Esta informacin no tiene Theme park manager el consejo del mdico. Asegrese de hacerle al mdico cualquier pregunta que tenga. Document Revised: 05/02/2021 Document Reviewed: 05/02/2021 Elsevier Patient Education  2024 [employer].

## 2023-09-27 ENCOUNTER — Ambulatory Visit: Payer: Self-pay | Admitting: Obstetrics and Gynecology

## 2023-09-27 VITALS — BP 110/78 | HR 64 | Wt 186.1 lb

## 2023-09-27 DIAGNOSIS — B351 Tinea unguium: Secondary | ICD-10-CM

## 2023-09-27 DIAGNOSIS — M62838 Other muscle spasm: Secondary | ICD-10-CM

## 2023-09-27 DIAGNOSIS — I83812 Varicose veins of left lower extremities with pain: Secondary | ICD-10-CM

## 2023-09-27 MED ORDER — FLUCONAZOLE 150 MG PO TABS
300.0000 mg | ORAL_TABLET | ORAL | 0 refills | Status: DC
Start: 2023-09-27 — End: 2024-05-08
  Filled 2023-09-27: qty 24, 84d supply, fill #0

## 2023-09-27 NOTE — Progress Notes (Signed)
[place], Francoise Ceo, NP   Chief Complaint  Patient presents with   Follow-up    Varicose vein and finger    HPI:      Ms. Courtney Soto is a 37 y.o. [phone] whose LMP was No LMP recorded. (Menstrual status: Lactating)., presents today for varicose vein LT leg follow up. Still having pain in legs, legs turn red and then purple at end of the day. Wearing compression hose without sx relief. Doesn't stand much of the day. Hasn't heard about [facility] financial application.   Pt still having problem with fungal infection on RT hand 2nd digit. Never got Rx to treat at last visit. Nail grows and but then splits and cracks.  Having headaches occipital and frontal areas for the past week. Pain is intense and takes tylenol with sx relief. Having dizziness with headaches. No vision changes, can still walk/talk with sx. No recent trauma/MVA. No recent illness. Feels warm, no fevers.  Patient Active Problem List   Diagnosis Date Noted   Fungal infection of nail 06/07/2023   Health care maintenance 04/05/2023   Vaginal discharge 04/05/2023   Fatigue 09/28/2022   Varicose vein of leg 06/21/2022   Encounter to establish care 05/31/2022   History of uterine cancer 05/31/2022   Low back pain 05/31/2022   Dental cavities 05/31/2022    No past surgical history on file.  Family History  Problem Relation Age of Onset   Healthy Mother    Kidney disease Father    Healthy Sister    Breast cancer Maternal Grandmother    Healthy Brother     Social History   Socioeconomic History   Marital status: Single    Spouse name: Not on file   Number of children: 3   Years of education: Not on file   Highest education level: 8th grade  Occupational History   Not on file  Tobacco Use   Smoking status: Never   Smokeless tobacco: Never  Vaping Use   Vaping status: Never Used  Substance and Sexual Activity   Alcohol use: Never   Drug use: Never   Sexual activity: Yes    Birth  control/protection: Condom, Implant  Other Topics Concern   Not on file  Social History Narrative   Not on file   Social Determinants of Health   Financial Resource Strain: Not on File (06/17/2021)   Received from [employer] [employer]    Financial Resource Strain: 0  Food Insecurity: Not on File (08/09/2023)   Received from [facility] Insecurity    Food: 0  Transportation Needs: Unmet Transportation Needs (07/25/2022)   PRAPARE - Administrator, Civil Service (Medical): Yes    Lack of Transportation (Non-Medical): Yes  Physical Activity: Not on File (06/17/2021)   Received from [place], [place]   Physical Activity    Physical Activity: 0  Stress: Not on File (06/17/2021)   Received from [facility], [place]   Stress    Stress: 0  Social Connections: Not on File (07/27/2023)   Received from [facility]   Social Connections    Connectedness: 0  Intimate Partner Violence: Not At Risk (06/12/2022)   Humiliation, Afraid, Rape, and Kick questionnaire    Fear of Current or Ex-Partner: No    Emotionally Abused: No    Physically Abused: No    Sexually Abused: No    Outpatient Medications Prior to Visit  Medication Sig Dispense Refill   ibuprofen (  ADVIL) 800 MG tablet Take 1 tablet (800 mg total) by mouth every 8 (eight) hours as needed for mild pain. 30 tablet 0   No facility-administered medications prior to visit.      ROS:  Review of Systems  Constitutional:  Negative for fatigue, fever and unexpected weight change.  Respiratory:  Negative for cough, shortness of breath and wheezing.   Cardiovascular:  Negative for chest pain, palpitations and leg swelling.  Gastrointestinal:  Negative for blood in stool, constipation, diarrhea, nausea and vomiting.  Endocrine: Negative for cold intolerance, heat intolerance and polyuria.  Genitourinary:  Negative for dyspareunia, dysuria, flank pain, frequency, genital sores, hematuria, menstrual problem, pelvic pain, urgency,  vaginal bleeding, vaginal discharge and vaginal pain.  Musculoskeletal:  Positive for neck pain. Negative for back pain, joint swelling and myalgias.  Skin:  Negative for rash.  Neurological:  Positive for dizziness and headaches. Negative for syncope, light-headedness and numbness.  Hematological:  Negative for adenopathy.  Psychiatric/Behavioral:  Negative for agitation, confusion, sleep disturbance and suicidal ideas. The patient is not nervous/anxious.    BREAST: No symptoms   OBJECTIVE:   Vitals:  BP 110/78 (BP Location: Left Arm, Patient Position: Sitting, Cuff Size: Large)   Pulse 64   Wt 186 lb 1.6 oz (84.4 kg)   BMI 33.50 kg/m   Physical Exam Constitutional:      Appearance: Normal appearance.  Pulmonary:     Effort: Pulmonary effort is normal.  Musculoskeletal:        General: Normal range of motion.     Cervical back: Normal range of motion. Muscular tenderness present.     Right lower leg: Normal.     Comments: LT leg with multiple varicose veins, no evid of thrombosis; no erythema/edema  Neurological:     General: No focal deficit present.     Mental Status: She is alert and oriented to person, place, and time.     Motor: No weakness.     Gait: Gait normal.  Psychiatric:        Judgment: Judgment normal.     Assessment/Plan: Onychomycosis - Plan: fluconazole (DIFLUCAN) 150 MG tablet; Rx diflucan weekly for 3 months. RTO for f/u and can extend tx prn.   Muscle spasms of neck--tender to palpate shoulders/neck. Recommended ibup/heating pad/stretch/massage. F/u prn.   Varicose veins of left lower extremity with pain--[place] will check with [facility] tomorrow about application. Once approved will do vascular MD ref.  Plan: Ambulatory referral to Vascular Surgery    Meds ordered this encounter  Medications   fluconazole (DIFLUCAN) 150 MG tablet    Sig: Take 2 tabs weekly for 3 months    Dispense:  24 tablet    Refill:  0    Order Specific Question:    Supervising Provider    Answer:   Hildred Laser [[id number]]      Return in about 3 months (around 12/28/2023) for f/u nail fungus.  Kalin Amrhein B. Dezerae Freiberger, PA-C 09/27/2023 6:57 PM

## 2023-09-28 ENCOUNTER — Other Ambulatory Visit: Payer: Self-pay

## 2023-10-02 ENCOUNTER — Other Ambulatory Visit: Payer: Self-pay

## 2023-12-04 ENCOUNTER — Other Ambulatory Visit (INDEPENDENT_AMBULATORY_CARE_PROVIDER_SITE_OTHER): Payer: Self-pay | Admitting: Nurse Practitioner

## 2023-12-04 DIAGNOSIS — I83812 Varicose veins of left lower extremities with pain: Secondary | ICD-10-CM

## 2023-12-09 DIAGNOSIS — I872 Venous insufficiency (chronic) (peripheral): Secondary | ICD-10-CM | POA: Insufficient documentation

## 2023-12-09 NOTE — Progress Notes (Unsigned)
MRN : [id number]  Courtney Soto is a 38 y.o. (November 12, 1986) female who presents with chief complaint of varicose veins hurt.  History of Present Illness:   The patient is seen for evaluation of symptomatic varicose veins. The patient relates burning and stinging which worsened steadily throughout the course of the day, particularly with standing. The patient also notes an aching and throbbing pain over the varicosities, particularly with prolonged dependent positions. The symptoms are significantly improved with elevation.  The patient also notes that during hot weather the symptoms are greatly intensified. The patient states the pain from the varicose veins interferes with work, daily exercise, shopping and household maintenance. At this point, the symptoms are persistent and severe enough that they're having a negative impact on lifestyle and are interfering with daily activities.  There is no history of DVT, PE or superficial thrombophlebitis. There is no history of ulceration or hemorrhage. The patient denies a significant family history of varicose veins.  The patient has not worn graduated compression in the past. At the present time the patient has not been using over-the-counter analgesics. There is no history of prior surgical intervention or sclerotherapy.   No outpatient medications have been marked as taking for the 12/10/23 encounter (Appointment) with Gilda Crease, Latina Craver, MD.    No past medical history on file.  No past surgical history on file.  Social History Social History   Tobacco Use   Smoking status: Never   Smokeless tobacco: Never  Vaping Use   Vaping status: Never Used  Substance Use Topics   Alcohol use: Never   Drug use: Never    Family History Family History  Problem Relation Age of Onset   Healthy Mother    Kidney disease Father    Healthy Sister    Breast cancer Maternal Grandmother    Healthy Brother     No Known  Allergies   REVIEW OF SYSTEMS (Negative unless checked)  Constitutional: [] Weight loss  [] Fever  [] Chills Cardiac: [] Chest pain   [] Chest pressure   [] Palpitations   [] Shortness of breath when laying flat   [] Shortness of breath with exertion. Vascular:  [] Pain in legs with walking   [x] Pain in legs with standing  [] History of DVT   [] Phlebitis   [] Swelling in legs   [x] Varicose veins   [] Non-healing ulcers Pulmonary:   [] Uses home oxygen   [] Productive cough   [] Hemoptysis   [] Wheeze  [] COPD   [] Asthma Neurologic:  [] Dizziness   [] Seizures   [] History of stroke   [] History of TIA  [] Aphasia   [] Vissual changes   [] Weakness or numbness in arm   [] Weakness or numbness in leg Musculoskeletal:   [] Joint swelling   [] Joint pain   [] Low back pain Hematologic:  [] Easy bruising  [] Easy bleeding   [] Hypercoagulable state   [] Anemic Gastrointestinal:  [] Diarrhea   [] Vomiting  [] Gastroesophageal reflux/heartburn   [] Difficulty swallowing. Genitourinary:  [] Chronic kidney disease   [] Difficult urination  [] Frequent urination   [] Blood in urine Skin:  [] Rashes   [] Ulcers  Psychological:  [] History of anxiety   []  History of major depression.  Physical Examination  There were no vitals filed for this visit. There is no height or weight on file to calculate BMI. Gen: WD/WN, NAD Head: [place]/AT, No temporalis wasting.  Ear/Nose/Throat: Hearing grossly intact, nares w/o erythema or drainage, pinna without lesions Eyes: PER, EOMI, sclera nonicteric.  Neck: Supple,  no gross masses.  No JVD.  Pulmonary:  Good air movement, no audible wheezing, no use of accessory muscles.  Cardiac: RRR, precordium not hyperdynamic. Vascular:  Large varicosities present, greater than 10 mm left.  Veins are tender to palpation  Mild venous stasis changes to the legs bilaterally.  Trace soft pitting edema CEAP C3sEpAsPr Vessel Right Left  Radial Palpable Palpable  Gastrointestinal: soft, non-distended. No guarding/no peritoneal  signs.  Musculoskeletal: M/S 5/5 throughout.  No deformity.  Neurologic: CN 2-12 intact. Pain and light touch intact in extremities.  Symmetrical.  Speech is fluent. Motor exam as listed above. Psychiatric: Judgment intact, Mood & affect appropriate for pt's clinical situation. Dermatologic: Venous rashes no ulcers noted.  No changes consistent with cellulitis. Lymph : No lichenification or skin changes of chronic lymphedema.  CBC Lab Results  Component Value Date   WBC 9.7 02/11/2022   HGB 12.7 02/11/2022   HCT 38.0 02/11/2022   MCV 87.2 02/11/2022   PLT 268 02/11/2022    BMET    Component Value Date/Time   NA 141 05/31/2022 1231   K 3.9 05/31/2022 1231   CL 104 05/31/2022 1231   CO2 21 05/31/2022 1231   GLUCOSE 80 05/31/2022 1231   GLUCOSE 119 (H) 02/11/2022 0505   BUN 8 05/31/2022 1231   CREATININE 0.65 05/31/2022 1231   CALCIUM 9.7 05/31/2022 1231   GFRNONAA >60 02/11/2022 0505   CrCl cannot be calculated (Patient's most recent lab result is older than the maximum 21 days allowed.).  COAG No results found for: "INR", "PROTIME"  Radiology No results found.   Assessment/Plan 1. Varicose veins of left lower extremity with pain (Primary)  Recommend:  The patient has large symptomatic varicose veins that are painful and associated with swelling. The patient is CEAP C4sEpAsPr   I have had a long discussion with the patient regarding  varicose veins and why they cause symptoms.  Patient will begin wearing graduated compression stockings class 1 on a daily basis, beginning first thing in the morning and removing them in the evening. The patient is instructed specifically not to sleep in the stockings.    The patient  will also begin using over-the-counter analgesics such as Motrin 600 mg po TID to help control the symptoms.    In addition, behavioral modification including elevation during the day will be initiated.    Pending the results of these changes the  patient  will be reevaluated in three months.   Duplex ultrasound of the venous system shows reflux in the great saphenous vein on the left normal deep venous system.  Further plans will be based on whether conservative therapies are successful at eliminating the pain and swelling.   2. Chronic venous insufficiency See #1    Levora Dredge, MD  12/09/2023 2:43 PM

## 2023-12-10 ENCOUNTER — Ambulatory Visit (INDEPENDENT_AMBULATORY_CARE_PROVIDER_SITE_OTHER): Payer: Self-pay | Admitting: Vascular Surgery

## 2023-12-10 ENCOUNTER — Ambulatory Visit (INDEPENDENT_AMBULATORY_CARE_PROVIDER_SITE_OTHER): Payer: Self-pay

## 2023-12-10 ENCOUNTER — Encounter (INDEPENDENT_AMBULATORY_CARE_PROVIDER_SITE_OTHER): Payer: Self-pay | Admitting: Vascular Surgery

## 2023-12-10 VITALS — BP 108/82 | HR 85 | Resp 16 | Wt 186.0 lb

## 2023-12-10 DIAGNOSIS — I83812 Varicose veins of left lower extremities with pain: Secondary | ICD-10-CM

## 2023-12-10 DIAGNOSIS — I872 Venous insufficiency (chronic) (peripheral): Secondary | ICD-10-CM

## 2023-12-11 ENCOUNTER — Encounter (INDEPENDENT_AMBULATORY_CARE_PROVIDER_SITE_OTHER): Payer: Self-pay | Admitting: Vascular Surgery

## 2023-12-27 ENCOUNTER — Ambulatory Visit: Payer: Self-pay | Admitting: Gerontology

## 2023-12-27 VITALS — BP 112/76 | HR 78 | Ht 63.0 in | Wt 188.8 lb

## 2023-12-27 DIAGNOSIS — B351 Tinea unguium: Secondary | ICD-10-CM

## 2023-12-27 DIAGNOSIS — R109 Unspecified abdominal pain: Secondary | ICD-10-CM | POA: Insufficient documentation

## 2023-12-27 DIAGNOSIS — N898 Other specified noninflammatory disorders of vagina: Secondary | ICD-10-CM

## 2023-12-27 DIAGNOSIS — I83812 Varicose veins of left lower extremities with pain: Secondary | ICD-10-CM

## 2023-12-27 LAB — POCT URINALYSIS DIPSTICK
Bilirubin, UA: NEGATIVE
Glucose, UA: NEGATIVE
Ketones, UA: NEGATIVE
Leukocytes, UA: NEGATIVE
Nitrite, UA: NEGATIVE
Protein, UA: NEGATIVE
Spec Grav, UA: >=1.03 — AB (ref 1.010–1.025)
Urobilinogen, UA: 1 U/dL
pH, UA: 7 (ref 5.0–8.0)

## 2023-12-27 NOTE — Progress Notes (Signed)
Established Patient Office Visit  Subjective   Patient ID: Courtney Soto, female    DOB: Jul 06, 1986  Age: 38 y.o. MRN: [id number]  Chief Complaint  Patient presents with   Follow-up    Pt is following up on onychomycosis on pointer finger of right hand. Pt saw specialist this past week and has not seen improvement. Pt also complaining of lower back pain.    HPI  Ms. Courtney Soto is a 38 y.o female who has history of lower extremity varicose vein who presents for follow-up visit.  At that time treatment for 3 months for the fungal infection on her right index finger, but she denies any improvement.  There is no erythema, swelling and drainage.  She followed up at the vein clinic on 12/10/23 and was seen by  Dr Renford Dills, she was advised to wear graduated compression stockings class I on a daily and not to sleep with it, and will follow-up in 3 months for reevaluation.  She also complained of left flank pain, that has been going on for 7 days.  She endorses urinary urgency, frequency, but denies dysuria, fever and chills.  She states pain is sharp sometimes intensity is 8/10.  She states that taking Tylenol relieves symptoms.  She also complained of whitish vaginal discharge with out odor that started 3 weeks ago.  She denies perineal itching redness.  Overall, she states that she is doing well and offers no further complaints.      Review of Systems  Constitutional: Negative.  Negative for fever.  Eyes: Negative.   Respiratory: Negative.    Cardiovascular: Negative.   Gastrointestinal: Negative.   Genitourinary:  Positive for flank pain, frequency and urgency.  Neurological: Negative.   Psychiatric/Behavioral: Negative.        Objective:     BP 112/76 (BP Location: Left Arm, Patient Position: Sitting, Cuff Size: Large)   Pulse 78   Ht 5\' 3"  (1.6 m)   Wt 188 lb 12.8 oz (85.6 kg)   LMP  (LMP Unknown)   SpO2 97%   BMI 33.44 kg/m  BP Readings from Last 3  Encounters:  12/27/23 112/76  12/10/23 108/82  09/27/23 110/78   Wt Readings from Last 3 Encounters:  12/27/23 188 lb 12.8 oz (85.6 kg)  12/10/23 186 lb (84.4 kg)  09/27/23 186 lb 1.6 oz (84.4 kg)   SpO2 Readings from Last 3 Encounters:  12/27/23 97%  06/07/23 98%  12/28/22 98%      Physical Exam HENT:     Head: Normocephalic and atraumatic.     Mouth/Throat:     Mouth: Mucous membranes are moist.  Eyes:     Pupils: Pupils are equal, round, and reactive to light.  Cardiovascular:     Rate and Rhythm: Normal rate and regular rhythm.     Pulses: Normal pulses.     Heart sounds: Normal heart sounds.  Pulmonary:     Effort: Pulmonary effort is normal.     Breath sounds: Normal breath sounds.  Abdominal:     General: Bowel sounds are normal.     Palpations: Abdomen is soft.     Tenderness: There is left CVA tenderness. There is no right CVA tenderness.  Musculoskeletal:     Cervical back: Normal range of motion.  Skin:    General: Skin is warm.     Capillary Refill: Capillary refill takes less than 2 seconds.  Neurological:     General: No focal deficit  present.     Mental Status: She is alert and oriented to person, place, and time.  Psychiatric:        Mood and Affect: Mood normal.        Behavior: Behavior normal.      Results for orders placed or performed in visit on 12/27/23  POCT Urinalysis Dipstick  Result Value Ref Range   Color, UA yellow    Clarity, UA clear    Glucose, UA Negative Negative   Bilirubin, UA negative    Ketones, UA negative    Spec Grav, UA >=1.030 (A) 1.010 - 1.025   Blood, UA 2+    pH, UA 7.0 5.0 - 8.0   Protein, UA Negative Negative   Urobilinogen, UA 1.0 0.2 or 1.0 E.U./dL   Nitrite, UA negative    Leukocytes, UA Negative Negative   Appearance     Odor      Last CBC Lab Results  Component Value Date   WBC 9.7 02/11/2022   HGB 12.7 02/11/2022   HCT 38.0 02/11/2022   MCV 87.2 02/11/2022   MCH 29.1 02/11/2022   RDW  13.2 02/11/2022   PLT 268 02/11/2022   Last metabolic panel Lab Results  Component Value Date   GLUCOSE 80 05/31/2022   NA 141 05/31/2022   K 3.9 05/31/2022   CL 104 05/31/2022   CO2 21 05/31/2022   BUN 8 05/31/2022   CREATININE 0.65 05/31/2022   EGFR 118 05/31/2022   CALCIUM 9.7 05/31/2022   PROT 7.6 05/31/2022   ALBUMIN 4.8 05/31/2022   LABGLOB 2.8 05/31/2022   AGRATIO 1.7 05/31/2022   BILITOT 0.6 05/31/2022   ALKPHOS 110 05/31/2022   AST 15 05/31/2022   ALT 11 05/31/2022   ANIONGAP 9 02/11/2022   Last lipids Lab Results  Component Value Date   CHOL 142 05/31/2022   HDL 50 05/31/2022   LDLCALC 73 05/31/2022   TRIG 106 05/31/2022   CHOLHDL 2.8 05/31/2022   Last hemoglobin A1c Lab Results  Component Value Date   HGBA1C 5.0 05/31/2022   Last thyroid functions Lab Results  Component Value Date   TSH 1.150 09/28/2022   Last vitamin D No results found for: "25OHVITD2", "25OHVITD3", "VD25OH" Last vitamin B12 and Folate No results found for: "VITAMINB12", "FOLATE"    The ASCVD Risk score (Arnett DK, et al., 2019) failed to calculate for the following reasons:   The 2019 ASCVD risk score is only valid for ages 45 to 68    Assessment & Plan      1. Varicose veins of left lower extremity with pain (Primary) -She was encouraged to continue wearing graduated compression stockings, complete [employer] application for vascular surgery follow-up.  She was advised to go to the emergency room for worsening symptoms.  2. Flank pain -POCT urinalysis dipstick was done and it showed 2+ blood, was negative for protein ,leukocyte and nitrite.  Differentials include nephrolithiasis.  She was advised to go to the emergency room for worsening symptoms. - POCT Urinalysis Dipstick; Future - POCT Urinalysis Dipstick  3. Fungal infection of nail -She was advised to complete the[facility] Financial application for podiatry follow-up.  4. Vaginal discharge -She was provided with  [place] health department for STI screening and evaluation of vaginal discharge since the clinic has no swab.     Return in about 2 weeks (around 01/10/2024), or if symptoms worsen or fail to improve.   Courtney Trellis Paganini, NP

## 2024-01-02 ENCOUNTER — Other Ambulatory Visit: Payer: Self-pay

## 2024-01-08 ENCOUNTER — Encounter: Payer: Self-pay | Admitting: Nurse Practitioner

## 2024-01-08 ENCOUNTER — Ambulatory Visit (LOCAL_COMMUNITY_HEALTH_CENTER): Payer: Self-pay | Admitting: Family Medicine

## 2024-01-08 VITALS — BP 103/73 | HR 77 | Ht 63.0 in | Wt 185.2 lb

## 2024-01-08 DIAGNOSIS — Z3009 Encounter for other general counseling and advice on contraception: Secondary | ICD-10-CM

## 2024-01-08 DIAGNOSIS — Z113 Encounter for screening for infections with a predominantly sexual mode of transmission: Secondary | ICD-10-CM

## 2024-01-08 DIAGNOSIS — Z01419 Encounter for gynecological examination (general) (routine) without abnormal findings: Secondary | ICD-10-CM

## 2024-01-08 LAB — WET PREP FOR TRICH, YEAST, CLUE
Trichomonas Exam: NEGATIVE
Yeast Exam: NEGATIVE

## 2024-01-08 LAB — HM HIV SCREENING LAB: HM HIV Screening: NEGATIVE

## 2024-01-08 NOTE — Progress Notes (Signed)
 Wet prep reviewed and no treatment indicated per standing order. Jossie Ng, RN

## 2024-01-08 NOTE — Progress Notes (Signed)
 [facility] HE[facility] DEPART[facility] [address] [place] [address] Main phone: [phone]  Family Planning Visit - Repeat Yearly Visit  Subjective:  Courtney Soto is a 38 y.o. [id number]  being seen today for an annual wellness visit and to discuss contraception options. The patient is currently using hormonal implant for pregnancy prevention. Patient does not want a pregnancy in the next year.   Patient reports they are looking for a method with the following characteristics:  High efficacy at preventing pregnancy  Patient has the following medical problems:  Patient Active Problem List   Diagnosis Date Noted   Flank pain 12/27/2023   Chronic venous insufficiency 12/09/2023   Fungal infection of nail 06/07/2023   Health care maintenance 04/05/2023   Vaginal discharge 04/05/2023   Fatigue 09/28/2022   Varicose vein of leg 06/21/2022   Encounter to establish care 05/31/2022   History of uterine cancer 05/31/2022   Low back pain 05/31/2022   Dental cavities 05/31/2022    Chief Complaint  Patient presents with   Annual Exam    HPI Patient reports to clinic for PE   Review of Systems  Constitutional:  Negative for weight loss.  Eyes:  Negative for blurred vision.  Respiratory:  Negative for cough and shortness of breath.   Cardiovascular:  Negative for claudication.  Gastrointestinal:  Negative for nausea.  Genitourinary:  Negative for dysuria and frequency.  Skin:  Negative for rash.  Neurological:  Positive for headaches.  Endo/Heme/Allergies:  Does not bruise/bleed easily.    See flowsheet for other program required questions.   Diabetes screening This patient is 38 y.o. with a BMI of Body mass index is 32.81 kg/m.Marland Kitchen  Is patient eligible for diabetes screening (age >35 and BMI >25)?  yes  Was Hgb A1c ordered? yes  STI screening Patient reports 1 of partners in last year.  Does this patient desire STI  screening?  Yes  Hepatitis C screening Has patient been screened once for HCV in the past?  No  No results found for: "HCVAB"  Does the patient meet criteria for HCV testing? No  (If yes-- Screen for HCV through [facility]) Criteria:  Since the last HCV result, does the patient have any of the following? - Current drug use - Have a partner with drug use - Has been incarcerated  Hepatitis B screening Does the patient meet criteria for HBV testing? No Criteria:  -Household, sexual or needle sharing contact with HBV -History of drug use -HIV positive -Those with known Hep C  Cervical Cancer Screening  Result Date Procedure Results Follow-ups  06/12/2022 IGP, Aptima HPV DIAGNOSIS:: Comment Specimen adequacy:: Comment Clinician Provided ICD10: Comment Performed by:: Comment PAP Smear Comment: . Note:: Comment Test Methodology: Comment HPV Aptima: Negative     Health Maintenance Due  Topic Date Due   Hepatitis C Screening  Never done   INFLUENZA VACCINE  06/14/2023   COVID-19 Vaccine (1 - 2024-25 season) Never done    The following portions of the patient's history were reviewed and updated as appropriate: allergies, current medications, past family history, past medical history, past social history, past surgical history and problem list. Problem list updated.  Objective:   Vitals:   01/08/24 1356  BP: 103/73  Pulse: 77  Weight: 185 lb 3.2 oz (84 kg)  Height: 5\' 3"  (1.6 m)    Physical Exam Vitals and nursing note reviewed. Exam conducted with a chaperone present Estill Dooms).  Constitutional:      Appearance: Normal appearance.  HENT:     Head: Normocephalic and atraumatic.     Mouth/Throat:     Mouth: Mucous membranes are moist.     Pharynx: Oropharynx is clear. No oropharyngeal exudate or posterior oropharyngeal erythema.  Pulmonary:     Effort: Pulmonary effort is normal.  Abdominal:     General: Abdomen is flat.     Palpations: There is no mass.      Tenderness: There is no abdominal tenderness. There is no rebound.  Genitourinary:    General: Normal vulva.     Exam position: Lithotomy position.     Pubic Area: No rash or pubic lice.      Labia:        Right: No rash or lesion.        Left: No rash or lesion.      Vagina: Normal. No vaginal discharge, erythema, bleeding or lesions.     Cervix: Discharge present. No cervical motion tenderness, friability, lesion or erythema.     Uterus: Normal.      Adnexa: Right adnexa normal and left adnexa normal.     Rectum: Normal.     Comments: pH = 4  Brown discharge vs menses Lymphadenopathy:     Head:     Right side of head: No preauricular or posterior auricular adenopathy.     Left side of head: No preauricular or posterior auricular adenopathy.     Cervical: No cervical adenopathy.     Upper Body:     Right upper body: No supraclavicular, axillary or epitrochlear adenopathy.     Left upper body: No supraclavicular, axillary or epitrochlear adenopathy.     Lower Body: No right inguinal adenopathy. No left inguinal adenopathy.  Skin:    General: Skin is warm and dry.     Findings: No rash.  Neurological:     Mental Status: She is alert and oriented to person, place, and time.     Assessment and Plan:  Courtney Soto is a 38 y.o. female [phone] presenting to the [facility] Department for an yearly wellness and contraception visit  1. Family planning Contraception counseling: Reviewed options based on patient desire and reproductive life plan. Patient is interested in Hormonal Implant. This was not provided to the patient today. Patient already has the implant in place.   Risks, benefits, and typical effectiveness rates were reviewed.  Questions were answered.  Written information was also given to the patient to review.    The patient will follow up in  1 years for surveillance.  The patient was told to call with any further questions, or with any concerns  about this method of contraception.  Emphasized use of condoms 100% of the time for STI prevention.  Educated on ECP and assessed need for ECP. Not indicated- Patient has implant in place.  2. Screening for venereal disease (Primary)  - Chlamydia/Gonorrhea [place] [facility] - HIV [place] [facility] - Syphilis Serology, [place] [facility] - WET PREP FOR TRICH, YEAST, CLUE  3. Well woman exam with routine gynecological exam CBE done 05/2022, repeat due 05/2025 -Reports some HA which resolve with Tylenol- believes they are a SE to Nexplanon. Reviewed that HA can be caused by the implant, but can also be due to other causes- patient decides to keep implant   - IGP, Aptima HPV - Hgb A1c w/o eAG   No follow-ups on file.  Future Appointments  Date Time Provider Department Center  01/09/2024 10:00 AM [facility] NURSE [facility] None  01/15/2024  2:00 PM Iloabachie, Chioma E, NP [facility] None  02/21/2024  1:00 PM Schnier, Latina Craver, MD [facility] None   Due to language barrier, a Spanish interpreter Rachel Bo T.) was present in person during the history-taking, subsequent discussion, and physical exam with this patient.   Lenice Llamas, [place]

## 2024-01-09 ENCOUNTER — Other Ambulatory Visit: Payer: Self-pay

## 2024-01-09 DIAGNOSIS — Z0000 Encounter for general adult medical examination without abnormal findings: Secondary | ICD-10-CM

## 2024-01-09 LAB — HGB A1C W/O EAG: Hgb A1c MFr Bld: 5.2 % (ref 4.8–5.6)

## 2024-01-10 ENCOUNTER — Other Ambulatory Visit: Payer: Self-pay

## 2024-01-10 ENCOUNTER — Other Ambulatory Visit: Payer: Self-pay | Admitting: Gerontology

## 2024-01-10 DIAGNOSIS — E559 Vitamin D deficiency, unspecified: Secondary | ICD-10-CM

## 2024-01-10 LAB — HEMOGLOBIN A1C
Est. average glucose Bld gHb Est-mCnc: 108 mg/dL
Hgb A1c MFr Bld: 5.4 % (ref 4.8–5.6)

## 2024-01-10 LAB — COMPREHENSIVE METABOLIC PANEL WITH GFR
ALT: 18 [IU]/L (ref 0–32)
AST: 18 [IU]/L (ref 0–40)
Albumin: 4.4 g/dL (ref 3.9–4.9)
Alkaline Phosphatase: 108 [IU]/L (ref 44–121)
BUN/Creatinine Ratio: 12 (ref 9–23)
BUN: 8 mg/dL (ref 6–20)
Bilirubin Total: 0.9 mg/dL (ref 0.0–1.2)
CO2: 22 mmol/L (ref 20–29)
Calcium: 9.2 mg/dL (ref 8.7–10.2)
Chloride: 105 mmol/L (ref 96–106)
Creatinine, Ser: 0.65 mg/dL (ref 0.57–1.00)
Globulin, Total: 2.8 g/dL (ref 1.5–4.5)
Glucose: 87 mg/dL (ref 70–99)
Potassium: 4.3 mmol/L (ref 3.5–5.2)
Sodium: 140 mmol/L (ref 134–144)
Total Protein: 7.2 g/dL (ref 6.0–8.5)
eGFR: 117 mL/min/{1.73_m2}

## 2024-01-10 LAB — LIPID PANEL
Chol/HDL Ratio: 3.7 {ratio} (ref 0.0–4.4)
Cholesterol, Total: 141 mg/dL (ref 100–199)
HDL: 38 mg/dL — ABNORMAL LOW (ref >39)
LDL Chol Calc (NIH): 86 mg/dL (ref 0–99)
Triglycerides: 92 mg/dL (ref 0–149)
VLDL Cholesterol Cal: 17 mg/dL (ref 5–40)

## 2024-01-10 LAB — CBC WITH DIFFERENTIAL/PLATELET
Basophils Absolute: 0 10*3/uL (ref 0.0–0.2)
Basos: 1 %
EOS (ABSOLUTE): 0.1 10*3/uL (ref 0.0–0.4)
Eos: 1 %
Hematocrit: 42.3 % (ref 34.0–46.6)
Hemoglobin: 14.4 g/dL (ref 11.1–15.9)
Immature Grans (Abs): 0 10*3/uL (ref 0.0–0.1)
Immature Granulocytes: 1 %
Lymphocytes Absolute: 1.7 10*3/uL (ref 0.7–3.1)
Lymphs: 31 %
MCH: 30.4 pg (ref 26.6–33.0)
MCHC: 34 g/dL (ref 31.5–35.7)
MCV: 89 fL (ref 79–97)
Monocytes Absolute: 0.3 10*3/uL (ref 0.1–0.9)
Monocytes: 5 %
Neutrophils Absolute: 3.5 10*3/uL (ref 1.4–7.0)
Neutrophils: 61 %
Platelets: 285 10*3/uL (ref 150–450)
RBC: 4.73 x10E6/uL (ref 3.77–5.28)
RDW: 12.6 % (ref 11.7–15.4)
WBC: 5.6 10*3/uL (ref 3.4–10.8)

## 2024-01-10 LAB — VITAMIN D 25 HYDROXY (VIT D DEFICIENCY, FRACTURES): Vit D, 25-Hydroxy: 15.2 ng/mL — ABNORMAL LOW (ref 30.0–100.0)

## 2024-01-10 MED ORDER — VITAMIN D (ERGOCALCIFEROL) 1.25 MG (50000 UNIT) PO CAPS
50000.0000 [IU] | ORAL_CAPSULE | ORAL | 0 refills | Status: DC
  Filled 2024-01-10: qty 16, 112d supply, fill #0

## 2024-01-10 NOTE — Progress Notes (Signed)
 [place] interpreter was used, her lab was reviewed, advised her to pick up Ergocalciferol 50,000 units weekly and get some sunlight. She verbalized understanding.

## 2024-01-15 ENCOUNTER — Other Ambulatory Visit: Payer: Self-pay

## 2024-01-15 ENCOUNTER — Ambulatory Visit: Payer: Self-pay | Admitting: Gerontology

## 2024-01-15 ENCOUNTER — Encounter: Payer: Self-pay | Admitting: Gerontology

## 2024-01-15 VITALS — BP 103/70 | HR 80 | Ht 63.0 in | Wt 190.0 lb

## 2024-01-15 DIAGNOSIS — I83812 Varicose veins of left lower extremities with pain: Secondary | ICD-10-CM

## 2024-01-15 DIAGNOSIS — B351 Tinea unguium: Secondary | ICD-10-CM

## 2024-01-15 MED ORDER — TERBINAFINE HCL 1 % EX CREA
1.0000 | TOPICAL_CREAM | Freq: Two times a day (BID) | CUTANEOUS | 0 refills | Status: DC
Start: 2024-01-15 — End: 2024-07-17
  Filled 2024-01-15: qty 30, 15d supply, fill #0

## 2024-01-15 NOTE — Progress Notes (Signed)
 Established Patient Office Visit  Subjective   Patient ID: Courtney Soto, female    DOB: [birth year]  Age: 38 y.o. MRN: [id number]  No chief complaint on file.   HPI Ms. Courtney Soto is a 38 y.o female who has history of lower extremity varicose vein who presents for follow-up visit. The patient reports that she experiences intermittent dull non radiating pain to her left leg  with ambulation sometimes. She has a scheduled appointment with Dr. Wynonia Lawman. Patient also reports not wearing her compression socks. The patient also reports fungal infection on the right index finger with no improvement despite taking Diflucan. No redness or swelling. Overall, she states she doing well and offers no further complains.  Patient Active Problem List   Diagnosis Date Noted   Flank pain 12/27/2023   Chronic venous insufficiency 12/09/2023   Fungal infection of nail 06/07/2023   Health care maintenance 04/05/2023   Vaginal discharge 04/05/2023   Fatigue 09/28/2022   Varicose vein of leg 06/21/2022   Encounter to establish care 05/31/2022   History of uterine cancer 05/31/2022   Low back pain 05/31/2022   Dental cavities 05/31/2022   Past Medical History:  Diagnosis Date   Frequent headaches    Lumbar herniated disc    Varicose veins of left lower extremity    No past surgical history on file. Social History   Tobacco Use   Smoking status: Never   Smokeless tobacco: Never  Vaping Use   Vaping status: Never Used  Substance Use Topics   Alcohol use: Not Currently    Comment: Last ETOH use (beer) 10/2023.   Drug use: Never   Social History   Socioeconomic History   Marital status: Single    Spouse name: Not on file   Number of children: 3   Years of education: Not on file   Highest education level: 8th grade  Occupational History   Not on file  Tobacco Use   Smoking status: Never   Smokeless tobacco: Never  Vaping Use   Vaping status: Never Used  Substance and  Sexual Activity   Alcohol use: Not Currently    Comment: Last ETOH use (beer) 10/2023.   Drug use: Never   Sexual activity: Yes    Birth control/protection: Condom, Implant  Other Topics Concern   Not on file  Social History Narrative   Not on file   Social Drivers of Health   Financial Resource Strain: Not on File (06/17/2021)   Received from [employer], [employer]    Financial Resource Strain: 0  Food Insecurity: Not on File (08/09/2023)   Received from [facility] Insecurity    Food: 0  Transportation Needs: Unmet Transportation Needs (07/25/2022)   PRAPARE - Administrator, Civil Service (Medical): Yes    Lack of Transportation (Non-Medical): Yes  Physical Activity: Not on File (06/17/2021)   Received from [place], [place]   Physical Activity    Physical Activity: 0  Stress: Not on File (06/17/2021)   Received from [facility], [place]   Stress    Stress: 0  Social Connections: Not on File (07/27/2023)   Received from [facility]   Social Connections    Connectedness: 0  Intimate Partner Violence: Not At Risk (01/08/2024)   Humiliation, Afraid, Rape, and Kick questionnaire    Fear of Current or Ex-Partner: No    Emotionally Abused: No    Physically Abused: No    Sexually  Abused: No   Family Status  Relation Name Status   Mother  Alive   Father  Deceased   Sister  Alive   MGM  Deceased   MGF  Deceased   PGM  Alive   PGF  Alive   Brother  Alive  No partnership data on file   Family History  Problem Relation Age of Onset   Healthy Mother    Kidney disease Father    Healthy Sister    Breast cancer Maternal Grandmother    Healthy Brother    No Known Allergies    Review of Systems  Constitutional: Negative.   HENT: Negative.    Eyes: Negative.   Respiratory: Negative.    Cardiovascular: Negative.   Gastrointestinal: Negative.   Genitourinary: Negative.   Musculoskeletal: Negative.   Skin: Negative.   Neurological: Negative.    Endo/Heme/Allergies: Negative.   Psychiatric/Behavioral: Negative.        Objective:     Wt 190 lb (86.2 kg)   LMP  (LMP Unknown)   BMI 33.66 kg/m  BP Readings from Last 3 Encounters:  01/15/24 103/70  01/08/24 103/73  12/27/23 112/76   Wt Readings from Last 3 Encounters:  01/15/24 190 lb (86.2 kg)  01/08/24 185 lb 3.2 oz (84 kg)  12/27/23 188 lb 12.8 oz (85.6 kg)      Physical Exam Constitutional:      Appearance: Normal appearance. She is normal weight.  HENT:     Head: Normocephalic.     Right Ear: Tympanic membrane, ear canal and external ear normal.     Left Ear: Tympanic membrane, ear canal and external ear normal.     Nose: Nose normal.     Mouth/Throat:     Mouth: Mucous membranes are moist.     Pharynx: Oropharynx is clear.  Eyes:     Extraocular Movements: Extraocular movements intact.     Conjunctiva/sclera: Conjunctivae normal.     Pupils: Pupils are equal, round, and reactive to light.  Cardiovascular:     Rate and Rhythm: Normal rate and regular rhythm.     Pulses: Normal pulses.     Heart sounds: Normal heart sounds.  Pulmonary:     Effort: Pulmonary effort is normal.     Breath sounds: Normal breath sounds.  Abdominal:     General: Abdomen is flat. Bowel sounds are normal.     Palpations: Abdomen is soft.  Musculoskeletal:        General: Normal range of motion.     Cervical back: Normal range of motion.  Skin:    General: Skin is warm and dry.     Capillary Refill: Capillary refill takes less than 2 seconds.  Neurological:     General: No focal deficit present.     Mental Status: She is alert. Mental status is at baseline.  Psychiatric:        Mood and Affect: Mood normal.        Behavior: Behavior normal.        Thought Content: Thought content normal.        Judgment: Judgment normal.      No results found for any visits on 01/15/24.  Last CBC Lab Results  Component Value Date   WBC 5.6 01/09/2024   HGB 14.4 01/09/2024    HCT 42.3 01/09/2024   MCV 89 01/09/2024   MCH 30.4 01/09/2024   RDW 12.6 01/09/2024   PLT 285 01/09/2024   Last metabolic panel Lab  Results  Component Value Date   GLUCOSE 87 01/09/2024   NA 140 01/09/2024   K 4.3 01/09/2024   CL 105 01/09/2024   CO2 22 01/09/2024   BUN 8 01/09/2024   CREATININE 0.65 01/09/2024   EGFR 117 01/09/2024   CALCIUM 9.2 01/09/2024   PROT 7.2 01/09/2024   ALBUMIN 4.4 01/09/2024   LABGLOB 2.8 01/09/2024   AGRATIO 1.7 05/31/2022   BILITOT 0.9 01/09/2024   ALKPHOS 108 01/09/2024   AST 18 01/09/2024   ALT 18 01/09/2024   ANIONGAP 9 02/11/2022   Last lipids Lab Results  Component Value Date   CHOL 141 01/09/2024   HDL 38 (L) 01/09/2024   LDLCALC 86 01/09/2024   TRIG 92 01/09/2024   CHOLHDL 3.7 01/09/2024   Last hemoglobin A1c Lab Results  Component Value Date   HGBA1C 5.4 01/09/2024   Last thyroid functions Lab Results  Component Value Date   TSH 1.150 09/28/2022   Last vitamin D Lab Results  Component Value Date   VD25OH 15.2 (L) 01/09/2024   Last vitamin B12 and Folate No results found for: "VITAMINB12", "FOLATE"    The ASCVD Risk score (Arnett DK, et al., 2019) failed to calculate for the following reasons:   The 2019 ASCVD risk score is only valid for ages 75 to 32    Assessment & Plan:  1. Fungal infection of nail (Primary) Fungal to right index finger with no redness or swelling. Patient educated on use of Lamisil cream. Pending [facility] [employer] application for podiatry referral. - terbinafine (LAMISIL) 1 % cream; Apply 1 Application topically 2 (two) times daily.  Dispense: 30 g; Refill: 0  2. Varicose veins of left lower extremity with pain Patient has a appoinment with Dr. Levora Dredge on April 10th. If symptoms worsen or do not improve, call the clinic or go the emergency room.     Problem List Items Addressed This Visit   None   No follow-ups on file.    Aldine Contes, RN

## 2024-01-16 LAB — IGP, APTIMA HPV
HPV Aptima: NEGATIVE
PAP Smear Comment: 0

## 2024-01-21 ENCOUNTER — Other Ambulatory Visit: Payer: Self-pay

## 2024-02-05 ENCOUNTER — Ambulatory Visit: Payer: Self-pay | Admitting: Gerontology

## 2024-02-21 ENCOUNTER — Ambulatory Visit (INDEPENDENT_AMBULATORY_CARE_PROVIDER_SITE_OTHER): Payer: Self-pay | Admitting: Vascular Surgery

## 2024-02-21 ENCOUNTER — Encounter (INDEPENDENT_AMBULATORY_CARE_PROVIDER_SITE_OTHER): Payer: Self-pay | Admitting: Vascular Surgery

## 2024-02-21 VITALS — BP 101/77 | HR 68 | Resp 16 | Wt 187.6 lb

## 2024-02-21 DIAGNOSIS — I8310 Varicose veins of unspecified lower extremity with inflammation: Secondary | ICD-10-CM

## 2024-02-21 DIAGNOSIS — I872 Venous insufficiency (chronic) (peripheral): Secondary | ICD-10-CM

## 2024-02-21 NOTE — Progress Notes (Signed)
 MRN : [id number]  Courtney Soto is a 38 y.o. (1985/12/15) female who presents with chief complaint of varicose veins hurt.  History of Present Illness:   The patient returns for followup evaluation 3 months after the initial visit. The patient continues to have pain in the lower extremities with dependency. The pain is lessened with elevation. Graduated compression stockings, Class I (20-30 mmHg), have been worn but the stockings do not eliminate the leg pain. Over-the-counter analgesics do not improve the symptoms. The degree of discomfort continues to interfere with daily activities. The patient notes the pain in the legs is causing problems with daily exercise, at the workplace and even with household activities and maintenance such as standing in the kitchen preparing meals and doing dishes.   Venous ultrasound shows normal deep venous system, no evidence of acute or chronic DVT.  Superficial reflux is present in the left great saphenous vein  No outpatient medications have been marked as taking for the 02/21/24 encounter (Appointment) with Gilda Crease, Latina Craver, MD.    Past Medical History:  Diagnosis Date   Frequent headaches    Lumbar herniated disc    Varicose veins of left lower extremity     No past surgical history on file.  Social History Social History   Tobacco Use   Smoking status: Never   Smokeless tobacco: Never  Vaping Use   Vaping status: Never Used  Substance Use Topics   Alcohol use: Not Currently    Comment: Last ETOH use (beer) 10/2023.   Drug use: Never    Family History Family History  Problem Relation Age of Onset   Healthy Mother    Kidney disease Father    Healthy Sister    Breast cancer Maternal Grandmother    Healthy Brother     No Known Allergies   REVIEW OF SYSTEMS (Negative unless checked)  Constitutional: [] Weight loss  [] Fever  [] Chills Cardiac: [] Chest pain   [] Chest pressure   [] Palpitations   [] Shortness of  breath when laying flat   [] Shortness of breath with exertion. Vascular:  [] Pain in legs with walking   [x] Pain in legs with standing  [] History of DVT   [] Phlebitis   [] Swelling in legs   [x] Varicose veins   [] Non-healing ulcers Pulmonary:   [] Uses home oxygen   [] Productive cough   [] Hemoptysis   [] Wheeze  [] COPD   [] Asthma Neurologic:  [] Dizziness   [] Seizures   [] History of stroke   [] History of TIA  [] Aphasia   [] Vissual changes   [] Weakness or numbness in arm   [] Weakness or numbness in leg Musculoskeletal:   [] Joint swelling   [] Joint pain   [] Low back pain Hematologic:  [] Easy bruising  [] Easy bleeding   [] Hypercoagulable state   [] Anemic Gastrointestinal:  [] Diarrhea   [] Vomiting  [] Gastroesophageal reflux/heartburn   [] Difficulty swallowing. Genitourinary:  [] Chronic kidney disease   [] Difficult urination  [] Frequent urination   [] Blood in urine Skin:  [] Rashes   [] Ulcers  Psychological:  [] History of anxiety   []  History of major depression.  Physical Examination  There were no vitals filed for this visit. There is no height or weight on file to calculate BMI. Gen: WD/WN, NAD Head: [place]/AT, No temporalis wasting.  Ear/Nose/Throat: Hearing grossly intact, nares w/o erythema or drainage, pinna without lesions Eyes: PER, EOMI, sclera nonicteric.  Neck: Supple, no gross masses.  No JVD.  Pulmonary:  Good air  movement, no audible wheezing, no use of accessory muscles.  Cardiac: RRR, precordium not hyperdynamic. Vascular:  Large varicosities present, greater than 10 mm left.  Veins are tender to palpation  Mild venous stasis changes to the legs bilaterally.  Trace soft pitting edema CEAP C3sEpAsPr Vessel Right Left  Radial Palpable Palpable  Gastrointestinal: soft, non-distended. No guarding/no peritoneal signs.  Musculoskeletal: M/S 5/5 throughout.  No deformity.  Neurologic: CN 2-12 intact. Pain and light touch intact in extremities.  Symmetrical.  Speech is fluent. Motor exam as  listed above. Psychiatric: Judgment intact, Mood & affect appropriate for pt's clinical situation. Dermatologic: Venous rashes no ulcers noted.  No changes consistent with cellulitis. Lymph : No lichenification or skin changes of chronic lymphedema.  CBC Lab Results  Component Value Date   WBC 5.6 01/09/2024   HGB 14.4 01/09/2024   HCT 42.3 01/09/2024   MCV 89 01/09/2024   PLT 285 01/09/2024    BMET    Component Value Date/Time   NA 140 01/09/2024 0000   K 4.3 01/09/2024 0000   CL 105 01/09/2024 0000   CO2 22 01/09/2024 0000   GLUCOSE 87 01/09/2024 0000   GLUCOSE 119 (H) 02/11/2022 0505   BUN 8 01/09/2024 0000   CREATININE 0.65 01/09/2024 0000   CALCIUM 9.2 01/09/2024 0000   GFRNONAA >60 02/11/2022 0505   CrCl cannot be calculated (Patient's most recent lab result is older than the maximum 21 days allowed.).  COAG No results found for: "INR", "PROTIME"  Radiology No results found.   Assessment/Plan 1. Varicose veins with inflammation (Primary) Recommend  I have reviewed my previous  discussion with the patient regarding  varicose veins and why they cause symptoms. Patient will continue  wearing graduated compression stockings class 1 on a daily basis, beginning first thing in the morning and removing them in the evening.  The patient is CEAP C3sEpAsPr.  The patient has been wearing compression for more than 12 weeks with no or little benefit.  The patient has been exercising daily for more than 12 weeks. The patient has been elevating and taking OTC pain medications for more than 12 weeks.  None of these have have eliminated the pain related to the varicose veins and venous reflux or the discomfort regarding venous congestion.    In addition, behavioral modification including elevation during the day was again discussed and this will continue.  The patient has utilized over the counter pain medications and has been exercising.  However, at this time conservative  therapy has not alleviated the patient's symptoms of leg pain and swelling  Recommend: laser ablation of the left great saphenous vein to eliminate the symptoms of pain and swelling of the lower extremities caused by the severe superficial venous reflux disease.   2. Chronic venous insufficiency Recommend  I have reviewed my previous  discussion with the patient regarding  varicose veins and why they cause symptoms. Patient will continue  wearing graduated compression stockings class 1 on a daily basis, beginning first thing in the morning and removing them in the evening.  The patient is CEAP C3sEpAsPr.  The patient has been wearing compression for more than 12 weeks with no or little benefit.  The patient has been exercising daily for more than 12 weeks. The patient has been elevating and taking OTC pain medications for more than 12 weeks.  None of these have have eliminated the pain related to the varicose veins and venous reflux or the discomfort regarding venous congestion.  In addition, behavioral modification including elevation during the day was again discussed and this will continue.  The patient has utilized over the counter pain medications and has been exercising.  However, at this time conservative therapy has not alleviated the patient's symptoms of leg pain and swelling  Recommend: laser ablation of the left great saphenous vein to eliminate the symptoms of pain and swelling of the lower extremities caused by the severe superficial venous reflux disease.     Devon Fogo, MD  02/21/2024 1:05 PM

## 2024-03-06 ENCOUNTER — Telehealth: Payer: Self-pay | Admitting: Gerontology

## 2024-03-06 NOTE — Telephone Encounter (Signed)
-----   Message from Donata Fryer sent at 03/04/2024  1:16 PM EDT ----- Ps schedule an in person appointment in  2 weeks thanks

## 2024-03-06 NOTE — Telephone Encounter (Signed)
 Scheduled f/u appt for 12:00 03/18/24

## 2024-03-18 ENCOUNTER — Ambulatory Visit: Payer: Self-pay | Admitting: Gerontology

## 2024-04-15 ENCOUNTER — Other Ambulatory Visit (INDEPENDENT_AMBULATORY_CARE_PROVIDER_SITE_OTHER): Payer: Self-pay

## 2024-04-15 ENCOUNTER — Telehealth (INDEPENDENT_AMBULATORY_CARE_PROVIDER_SITE_OTHER): Payer: Self-pay | Admitting: Vascular Surgery

## 2024-04-15 ENCOUNTER — Other Ambulatory Visit: Payer: Self-pay

## 2024-04-15 MED ORDER — ALPRAZOLAM 0.5 MG PO TABS
ORAL_TABLET | ORAL | 0 refills | Status: DC
  Filled 2024-04-18: qty 2, 1d supply, fill #0

## 2024-04-15 NOTE — Telephone Encounter (Signed)
 Patient is scheduled for a left leg GSV laser ablation with Dr. Prescilla Brod on 6.12.25. Patient will need the standard RX protocol called in to [facility] outpatient pharmacy. Thank you!

## 2024-04-15 NOTE — Telephone Encounter (Signed)
 sent

## 2024-04-18 ENCOUNTER — Other Ambulatory Visit: Payer: Self-pay

## 2024-04-24 ENCOUNTER — Other Ambulatory Visit (INDEPENDENT_AMBULATORY_CARE_PROVIDER_SITE_OTHER): Payer: Self-pay | Admitting: Vascular Surgery

## 2024-04-30 ENCOUNTER — Other Ambulatory Visit: Payer: Self-pay

## 2024-04-30 NOTE — Progress Notes (Signed)
    MRN : [id number]  Courtney Soto is a 38 y.o. (11-May-1986) female who presents with chief complaint of No chief complaint on file. .    The patient's left lower extremity was sterilely prepped and draped.  The ultrasound machine was used to visualize the great saphenous vein throughout its course.  A segment above the knee was selected for access.  The saphenous vein was accessed without difficulty using ultrasound guidance with a micropuncture needle.   An 0.018  wire was placed beyond the saphenofemoral junction through the sheath and the microneedle was removed.  The 65 cm sheath was then placed over the wire and the wire and dilator were removed.  The laser fiber was placed through the sheath and the ultrasound was returned to the saphenofemoral junction.  However the laser was not identified in the saphenofemoral junction.  Manipulation of the laser showed that it was in the superficial saphenous vein.  I then followed the saphenous vein from the junction distally.  In the mid thigh there appeared to be a transition where the laser entered the deep system.  Closer examination of this area suggested the saphenous vein might be discontinuous.  Attempts at manipulating the wire into the true saphenous vein were unsuccessful and the sheath was removed.  The ultrasound was then used to examine the saphenous vein just proximal to this area.  Lidocaine  was used to anesthetize the skin and the microneedle was inserted into the saphenous vein without difficulty.  Microwire was then advanced followed by the micro sheath.  J-wire was then advanced.  The sheath was then advanced over the J-wire.  The laser was then introduced into the sheath and the ultrasound returned to the saphenofemoral junction.  The laser was well-visualized at the saphenofemoral junction.  The laser was positioned with its tip approximately 2 cm below the saphenofemoral junction.  Tumescent anesthesia was then created with a dilute  lidocaine  solution.  Laser energy was then delivered with constant withdrawal of the sheath and laser fiber.  Approximately 874 joules of energy were delivered over a length of 14 cm.  Sterile dressings were placed.  The patient tolerated the procedure well without complications.

## 2024-05-01 ENCOUNTER — Encounter (INDEPENDENT_AMBULATORY_CARE_PROVIDER_SITE_OTHER): Payer: Self-pay | Admitting: Vascular Surgery

## 2024-05-01 ENCOUNTER — Encounter (INDEPENDENT_AMBULATORY_CARE_PROVIDER_SITE_OTHER): Payer: Self-pay

## 2024-05-01 ENCOUNTER — Ambulatory Visit (INDEPENDENT_AMBULATORY_CARE_PROVIDER_SITE_OTHER): Payer: Self-pay | Admitting: Vascular Surgery

## 2024-05-01 VITALS — BP 108/74 | HR 82 | Resp 16 | Wt 186.4 lb

## 2024-05-01 DIAGNOSIS — I8310 Varicose veins of unspecified lower extremity with inflammation: Secondary | ICD-10-CM

## 2024-05-06 ENCOUNTER — Other Ambulatory Visit (INDEPENDENT_AMBULATORY_CARE_PROVIDER_SITE_OTHER): Payer: Self-pay | Admitting: Vascular Surgery

## 2024-05-06 ENCOUNTER — Ambulatory Visit: Payer: Self-pay | Admitting: Gerontology

## 2024-05-06 DIAGNOSIS — I8310 Varicose veins of unspecified lower extremity with inflammation: Secondary | ICD-10-CM

## 2024-05-08 ENCOUNTER — Ambulatory Visit (INDEPENDENT_AMBULATORY_CARE_PROVIDER_SITE_OTHER): Payer: Self-pay

## 2024-05-08 ENCOUNTER — Encounter (INDEPENDENT_AMBULATORY_CARE_PROVIDER_SITE_OTHER): Payer: Self-pay | Admitting: Nurse Practitioner

## 2024-05-08 ENCOUNTER — Ambulatory Visit: Payer: Self-pay | Admitting: Gerontology

## 2024-05-08 VITALS — BP 104/72 | HR 80 | Temp 98.1°F | Resp 18 | Wt 186.1 lb

## 2024-05-08 DIAGNOSIS — I8310 Varicose veins of unspecified lower extremity with inflammation: Secondary | ICD-10-CM

## 2024-05-08 DIAGNOSIS — I872 Venous insufficiency (chronic) (peripheral): Secondary | ICD-10-CM

## 2024-05-08 NOTE — Progress Notes (Signed)
 Established Patient Office Visit  Subjective   Patient ID: Courtney Soto, female    DOB: [birth year]  Age: 38 y.o. MRN: [id number]  No chief complaint on file.  Pacific language interpreter was used.  HPI  Courtney Soto is a 38 y.o female who has history of lower extremity varicose vein who presents for follow-up visit after VAS US  LEFT leg vein ablation  on  04/28/24. She reported feeling well and tolerated procedure well. She denies pain, claudication and erythema,  however she takes tylenol  500 mg x 8 hours  for pain in the left leg as needed. Her pain ranges from 5/10 to 7/10 depending how long she was  on her feet. She is very concern about outstanding bill from surgery and  follow up visit today with vein specialty.  She continues to wear compression stockings, states that she's doing well and offers no further complaints.        Review of Systems  Constitutional: Negative.   HENT: Negative.    Eyes: Negative.   Respiratory: Negative.    Cardiovascular: Negative.   Gastrointestinal: Negative.   Genitourinary: Negative.   Musculoskeletal: Negative.   Skin: Negative.   Neurological: Negative.   Endo/Heme/Allergies: Negative.   Psychiatric/Behavioral: Negative.        Objective:     BP 104/72 (BP Location: Right Arm, Patient Position: Sitting, Cuff Size: Normal)   Pulse 80   Temp 98.1 F (36.7 C) (Oral)   Resp 18   Wt 186 lb 1.6 oz (84.4 kg)   SpO2 95%   BMI 32.97 kg/m  Wt Readings from Last 3 Encounters:  05/08/24 186 lb 1.6 oz (84.4 kg)  05/01/24 186 lb 6.4 oz (84.6 kg)  02/21/24 187 lb 9.6 oz (85.1 kg)   SpO2 Readings from Last 3 Encounters:  05/08/24 95%  01/15/24 97%  12/27/23 97%      Physical Exam HENT:     Head: Normocephalic and atraumatic.     Mouth/Throat:     Mouth: Mucous membranes are moist.     Pharynx: Oropharynx is clear.   Eyes:     Extraocular Movements: Extraocular movements intact.     Pupils: Pupils are  equal, round, and reactive to light.    Cardiovascular:     Rate and Rhythm: Normal rate and regular rhythm.     Pulses: Normal pulses.     Heart sounds: Normal heart sounds.  Pulmonary:     Effort: Pulmonary effort is normal.     Breath sounds: Normal breath sounds.   Musculoskeletal:        General: Normal range of motion.   Skin:    General: Skin is warm.     Capillary Refill: Capillary refill takes less than 2 seconds.   Neurological:     General: No focal deficit present.     Mental Status: She is alert and oriented to person, place, and time.   Psychiatric:        Mood and Affect: Mood normal.        Behavior: Behavior normal.      No results found for any visits on 05/08/24.  Last CBC Lab Results  Component Value Date   WBC 5.6 01/09/2024   HGB 14.4 01/09/2024   HCT 42.3 01/09/2024   MCV 89 01/09/2024   MCH 30.4 01/09/2024   RDW 12.6 01/09/2024   PLT 285 01/09/2024   Last metabolic panel Lab Results  Component Value Date  GLUCOSE 87 01/09/2024   NA 140 01/09/2024   K 4.3 01/09/2024   CL 105 01/09/2024   CO2 22 01/09/2024   BUN 8 01/09/2024   CREATININE 0.65 01/09/2024   EGFR 117 01/09/2024   CALCIUM 9.2 01/09/2024   PROT 7.2 01/09/2024   ALBUMIN 4.4 01/09/2024   LABGLOB 2.8 01/09/2024   AGRATIO 1.7 05/31/2022   BILITOT 0.9 01/09/2024   ALKPHOS 108 01/09/2024   AST 18 01/09/2024   ALT 18 01/09/2024   ANIONGAP 9 02/11/2022   Last lipids Lab Results  Component Value Date   CHOL 141 01/09/2024   HDL 38 (L) 01/09/2024   LDLCALC 86 01/09/2024   TRIG 92 01/09/2024   CHOLHDL 3.7 01/09/2024      The ASCVD Risk score (Arnett DK, et al., 2019) failed to calculate for the following reasons:   The 2019 ASCVD risk score is only valid for ages 109 to 42    Assessment & Plan:  .SABRA1. Chronic venous insufficiency (Primary) - She was encouraged to continue  wearing compression stockings  with  post vein oblation.    - monitor for symptoms of  infection ( redness, fever, increase in pain, new bruising). - report immediately  to clinic any symptoms such us  ( fever, increase in pain, redness at the area of procedure). - continue Tylenol   500 mg as needed  every 8 hours for pain control.  - appointment made to discuss  [facility] care application and outstanding bill.  - contact office in case of any changes and questions.          Return in about 4 weeks (around 06/05/2024), or f/u 06/05/24 in clinic, schedule apt. with [facility] 05/13/24 @ 12:30.    Chioma E Iloabachie, NP

## 2024-05-13 ENCOUNTER — Ambulatory Visit: Payer: Self-pay

## 2024-05-22 ENCOUNTER — Ambulatory Visit (INDEPENDENT_AMBULATORY_CARE_PROVIDER_SITE_OTHER): Payer: Self-pay | Admitting: Nurse Practitioner

## 2024-05-29 ENCOUNTER — Ambulatory Visit (INDEPENDENT_AMBULATORY_CARE_PROVIDER_SITE_OTHER): Payer: Self-pay | Admitting: Nurse Practitioner

## 2024-05-29 ENCOUNTER — Encounter (INDEPENDENT_AMBULATORY_CARE_PROVIDER_SITE_OTHER): Payer: Self-pay | Admitting: Nurse Practitioner

## 2024-05-29 VITALS — BP 103/72 | HR 92 | Wt 188.0 lb

## 2024-05-29 DIAGNOSIS — I83812 Varicose veins of left lower extremities with pain: Secondary | ICD-10-CM

## 2024-06-02 ENCOUNTER — Encounter (INDEPENDENT_AMBULATORY_CARE_PROVIDER_SITE_OTHER): Payer: Self-pay | Admitting: Nurse Practitioner

## 2024-06-02 NOTE — Progress Notes (Signed)
 Subjective:    Patient ID: Courtney Soto, female    DOB: 21-May-1986, 38 y.o.   MRN: [id number] Chief Complaint  Patient presents with   4 week post left leg GSV.    The patient returns to the office for followup status post laser ablation of the left tgreat saphenous vein on 05/01/2024.  The patient note significant improvement in the lower extremity pain but not resolution of the symptoms. The patient notes multiple residual varicosities bilaterally which continued to hurt with dependent positions and remained tender to palpation. The patient's swelling is minimally from preoperative status. The patient continues to wear graduated compression stockings on a daily basis but these are not eliminating the pain and discomfort. The patient continues to use over-the-counter anti-inflammatory medications to treat the pain and related symptoms but this has not given the patient relief. The patient notes the pain in the lower extremities is causing problems with daily exercise, problems at work and even with household activities such as preparing meals and doing dishes.  The patient is otherwise done well and there have been no complications related to the laser procedure or interval changes in the patient's overall   Post laser ultrasound shows successful ablation of the left gsv      Review of Systems  Cardiovascular:  Positive for leg swelling.  All other systems reviewed and are negative.      Objective:   Physical Exam Vitals reviewed.  HENT:     Head: Normocephalic.  Cardiovascular:     Rate and Rhythm: Normal rate.     Pulses: Normal pulses.  Pulmonary:     Effort: Pulmonary effort is normal.  Skin:    General: Skin is warm and dry.  Neurological:     Mental Status: She is alert and oriented to person, place, and time.  Psychiatric:        Mood and Affect: Mood normal.        Behavior: Behavior normal.        Thought Content: Thought content normal.        Judgment:  Judgment normal.     BP 103/72   Pulse 92   Wt 188 lb (85.3 kg)   BMI 33.30 kg/m   Past Medical History:  Diagnosis Date   Frequent headaches    Lumbar herniated disc    Varicose veins of left lower extremity     Social History   Socioeconomic History   Marital status: Single    Spouse name: Not on file   Number of children: 3   Years of education: Not on file   Highest education level: 8th grade  Occupational History   Not on file  Tobacco Use   Smoking status: Never   Smokeless tobacco: Never  Vaping Use   Vaping status: Never Used  Substance and Sexual Activity   Alcohol use: Not Currently    Comment: Last ETOH use (beer) 10/2023.   Drug use: Never   Sexual activity: Yes    Birth control/protection: Condom, Implant  Other Topics Concern   Not on file  Social History Narrative   Not on file   Social Drivers of Health   Financial Resource Strain: Not on File (06/17/2021)   Received from [employer]    Financial Resource Strain: 0  Food Insecurity: Not on File (08/09/2023)   Received from [facility] Insecurity    Food: 0  Transportation Needs: Unmet Transportation Needs (07/25/2022)  PRAPARE - Administrator, Civil Service (Medical): Yes    Lack of Transportation (Non-Medical): Yes  Physical Activity: Not on File (06/17/2021)   Received from [facility]   Physical Activity    Physical Activity: 0  Stress: Not on File (06/17/2021)   Received from [facility]   Stress    Stress: 0  Social Connections: Not on File (07/27/2023)   Received from [facility]   Social Connections    Connectedness: 0  Intimate Partner Violence: Not At Risk (01/08/2024)   Humiliation, Afraid, Rape, and Kick questionnaire    Fear of Current or Ex-Partner: No    Emotionally Abused: No    Physically Abused: No    Sexually Abused: No    History reviewed. No pertinent surgical history.  Family History  Problem Relation Age of Onset   Healthy Mother    Kidney  disease Father    Healthy Sister    Breast cancer Maternal Grandmother    Healthy Brother     No Known Allergies     Latest Ref Rng & Units 01/09/2024   12:00 AM 02/11/2022    5:05 AM  CBC  WBC 3.4 - 10.8 x10E3/uL 5.6  9.7   Hemoglobin 11.1 - 15.9 g/dL 85.5  87.2   Hematocrit 34.0 - 46.6 % 42.3  38.0   Platelets 150 - 450 x10E3/uL 285  268       CMP     Component Value Date/Time   NA 140 01/09/2024 0000   K 4.3 01/09/2024 0000   CL 105 01/09/2024 0000   CO2 22 01/09/2024 0000   GLUCOSE 87 01/09/2024 0000   GLUCOSE 119 (H) 02/11/2022 0505   BUN 8 01/09/2024 0000   CREATININE 0.65 01/09/2024 0000   CALCIUM 9.2 01/09/2024 0000   PROT 7.2 01/09/2024 0000   ALBUMIN 4.4 01/09/2024 0000   AST 18 01/09/2024 0000   ALT 18 01/09/2024 0000   ALKPHOS 108 01/09/2024 0000   BILITOT 0.9 01/09/2024 0000   EGFR 117 01/09/2024 0000   GFRNONAA >60 02/11/2022 0505     No results found.     Assessment & Plan:   1. Varicose veins of left lower extremity with pain (Primary) The patient notes that the pain and discomfort that she felt is improved in her left lower extremity.  She does still have a notable varicosity that is uncomfortable for her.  However at this time she does not wish further intervention but wants to wait a short time to see if it improves with time.  She will continue with use of medical grade compression elevation and activity.  Will have the patient return in 3 months to see if there is improvement and discuss further possible procedure if necessary.   Current Outpatient Medications on File Prior to Visit  Medication Sig Dispense Refill   ALPRAZolam  (XANAX ) 0.5 MG tablet Take 1st tablet 1 hour before procedure and take 2nd tablet once arrived at the office 2 tablet 0   terbinafine  (LAMISIL ) 1 % cream Apply 1 Application topically 2 (two) times daily. 30 g 0   Vitamin D , Ergocalciferol , (DRISDOL ) 1.25 MG (50000 UNIT) CAPS capsule Take 1 capsule (50,000 Units total)  by mouth every 7 (seven) days. 16 capsule 0   ibuprofen  (ADVIL ) 800 MG tablet Take 1 tablet (800 mg total) by mouth every 8 (eight) hours as needed for mild pain. (Patient not taking: Reported on 05/29/2024) 30 tablet 0   No current facility-administered medications on file  prior to visit.    There are no Patient Instructions on file for this visit. No follow-ups on file.   Joeanne Robicheaux E Hudsyn Barich, NP

## 2024-06-05 ENCOUNTER — Ambulatory Visit: Payer: Self-pay | Admitting: Gerontology

## 2024-07-17 ENCOUNTER — Ambulatory Visit: Payer: Self-pay | Admitting: Gerontology

## 2024-07-17 ENCOUNTER — Other Ambulatory Visit: Payer: Self-pay

## 2024-07-17 VITALS — BP 105/74 | HR 81 | Ht 65.16 in | Wt 187.9 lb

## 2024-07-17 DIAGNOSIS — N644 Mastodynia: Secondary | ICD-10-CM | POA: Insufficient documentation

## 2024-07-17 NOTE — Progress Notes (Signed)
 Established Patient Office Visit  Subjective   Patient ID: Courtney Soto, female    DOB: 1986/01/03  Age: 38 y.o. MRN: [id number]  Chief Complaint  Patient presents with   Follow-up   Breast Pain    Bilateral, for one month, no attempted treatments   [employer] Language line was used  HPI  Ms. Courtney Soto is a 38 y.o female who has history of lower extremity varicose vein who presents for follow-up visit and c/o bilateral breast pain that started 1 month ago. She was seen by the Vascular Surgery on 05/29/24 for 4 weeks post left leg GSV,She does still have a notable varicosity that is uncomfortable for her. However at this time she does not wish further intervention but wants to wait a short time to see if it improves with time. She will continue with use of medical grade compression elevation and activity. Will have the patient return in 3 months to see if there is improvement and discuss further possible procedure if necessary. Currently, she states that she wears her compression stockings  and will follow up if needed. She c/o bilateral pain to inner aspect of both breasts that started 1 month ago, denies lump or nipple discharge or inverted nipples. She states that  pain is constant and 8/10. She denies taking any pain medication. She had Mammogram and ultrasound  done on  07/27/22 it showed1. No mammographic or sonographic evidence of malignancy at the site of palpable concern in the RIGHT breast. Any further workup of the patient's symptoms should be based on the clinical assessment. Recommend routine annual screening mammogram at the age of 45 unless there are intervening clinical concerns. 2. No mammographic evidence of malignancy bilaterally. Overall, she states that she's doing well and offers no further complaints.    Review of Systems  Constitutional: Negative.   Respiratory: Negative.    Cardiovascular: Negative.   Skin: Negative.        Pain to inner aspect of  bilateral breast.  Neurological: Negative.   Psychiatric/Behavioral: Negative.        Objective:     BP 105/74 (BP Location: Right Arm, Patient Position: Sitting, Cuff Size: Normal)   Pulse 81   Ht 5' 5.16 (1.655 m)   Wt 187 lb 14.4 oz (85.2 kg)   SpO2 97%   BMI 31.12 kg/m  BP Readings from Last 3 Encounters:  07/17/24 105/74  05/29/24 103/72  05/08/24 104/72   Wt Readings from Last 3 Encounters:  07/17/24 187 lb 14.4 oz (85.2 kg)  05/29/24 188 lb (85.3 kg)  05/08/24 186 lb 1.6 oz (84.4 kg)      Physical Exam HENT:     Head: Normocephalic and atraumatic.     Mouth/Throat:     Mouth: Mucous membranes are moist.     Pharynx: Oropharynx is clear.  Eyes:     Extraocular Movements: Extraocular movements intact.     Conjunctiva/sclera: Conjunctivae normal.     Pupils: Pupils are equal, round, and reactive to light.  Neck:     Comments: Pain to inner aspect of bilateral breast.  Cardiovascular:     Rate and Rhythm: Normal rate and regular rhythm.     Pulses: Normal pulses.     Heart sounds: Normal heart sounds.  Pulmonary:     Effort: Pulmonary effort is normal.     Breath sounds: Normal breath sounds.  Skin:    General: Skin is warm.  Neurological:     General:  No focal deficit present.     Mental Status: She is alert and oriented to person, place, and time.  Psychiatric:        Mood and Affect: Mood normal.        Behavior: Behavior normal.      No results found for any visits on 07/17/24.  Last CBC Lab Results  Component Value Date   WBC 5.6 01/09/2024   HGB 14.4 01/09/2024   HCT 42.3 01/09/2024   MCV 89 01/09/2024   MCH 30.4 01/09/2024   RDW 12.6 01/09/2024   PLT 285 01/09/2024   Last metabolic panel Lab Results  Component Value Date   GLUCOSE 87 01/09/2024   NA 140 01/09/2024   K 4.3 01/09/2024   CL 105 01/09/2024   CO2 22 01/09/2024   BUN 8 01/09/2024   CREATININE 0.65 01/09/2024   EGFR 117 01/09/2024   CALCIUM 9.2 01/09/2024   PROT  7.2 01/09/2024   ALBUMIN 4.4 01/09/2024   LABGLOB 2.8 01/09/2024   AGRATIO 1.7 05/31/2022   BILITOT 0.9 01/09/2024   ALKPHOS 108 01/09/2024   AST 18 01/09/2024   ALT 18 01/09/2024   ANIONGAP 9 02/11/2022   Last lipids Lab Results  Component Value Date   CHOL 141 01/09/2024   HDL 38 (L) 01/09/2024   LDLCALC 86 01/09/2024   TRIG 92 01/09/2024   CHOLHDL 3.7 01/09/2024   Last hemoglobin A1c Lab Results  Component Value Date   HGBA1C 5.4 01/09/2024   Last thyroid functions Lab Results  Component Value Date   TSH 1.150 09/28/2022   Last vitamin D  Lab Results  Component Value Date   VD25OH 15.2 (L) 01/09/2024   Last vitamin B12 and Folate No results found for: VITAMINB12, FOLATE    The ASCVD Risk score (Arnett DK, et al., 2019) failed to calculate for the following reasons:   The 2019 ASCVD risk score is only valid for ages 41 to 50    Assessment & Plan:   1. Breast pain in female (Primary) - She will [facility] Mammogram. She was advised to notify clinic for worsening pain. - MM 3D DIAGNOSTIC MAMMOGRAM BILATERAL BREAST; Future - Pregnancy, urine; Future - hCG, serum, qualitative; Future - hCG, serum, qualitative - Pregnancy, urine   Return in about 4 weeks (around 08/14/2024), or if symptoms worsen or fail to improve.    Tinsley Lomas E Koy Lamp, NP

## 2024-07-18 ENCOUNTER — Other Ambulatory Visit: Payer: Self-pay

## 2024-07-18 DIAGNOSIS — N644 Mastodynia: Secondary | ICD-10-CM

## 2024-07-19 LAB — PREGNANCY, URINE: Preg Test, Ur: NEGATIVE

## 2024-07-19 LAB — HCG, SERUM, QUALITATIVE: hCG,Beta Subunit,Qual,Serum: NEGATIVE m[IU]/mL (ref <6)

## 2024-07-28 ENCOUNTER — Ambulatory Visit: Payer: Self-pay

## 2024-07-28 ENCOUNTER — Other Ambulatory Visit: Payer: Self-pay

## 2024-08-14 ENCOUNTER — Ambulatory Visit: Payer: Self-pay

## 2024-09-01 ENCOUNTER — Ambulatory Visit (INDEPENDENT_AMBULATORY_CARE_PROVIDER_SITE_OTHER): Payer: Self-pay | Admitting: Nurse Practitioner

## 2024-09-19 ENCOUNTER — Ambulatory Visit (INDEPENDENT_AMBULATORY_CARE_PROVIDER_SITE_OTHER): Payer: Self-pay | Admitting: Nurse Practitioner

## 2024-10-16 ENCOUNTER — Telehealth: Payer: Self-pay | Admitting: Gerontology

## 2024-10-30 ENCOUNTER — Other Ambulatory Visit: Payer: Self-pay

## 2024-10-30 ENCOUNTER — Ambulatory Visit: Payer: Self-pay | Admitting: Gerontology

## 2024-10-30 VITALS — BP 108/73 | HR 79 | Ht 63.0 in | Wt 184.0 lb

## 2024-10-30 DIAGNOSIS — M7989 Other specified soft tissue disorders: Secondary | ICD-10-CM

## 2024-10-30 DIAGNOSIS — E559 Vitamin D deficiency, unspecified: Secondary | ICD-10-CM

## 2024-10-30 DIAGNOSIS — H04123 Dry eye syndrome of bilateral lacrimal glands: Secondary | ICD-10-CM

## 2024-10-30 DIAGNOSIS — H04129 Dry eye syndrome of unspecified lacrimal gland: Secondary | ICD-10-CM | POA: Insufficient documentation

## 2024-10-30 MED ORDER — ARTIFICIAL TEARS OPHTHALMIC OINT
1.0000 | TOPICAL_OINTMENT | Freq: Every evening | OPHTHALMIC | 0 refills | Status: AC | PRN
  Filled 2024-10-30: qty 5, 5d supply, fill #0

## 2024-10-30 NOTE — Progress Notes (Signed)
 Established Patient Office Visit  Subjective   Patient ID: Courtney Soto, female    DOB: May 30, 1986  Age: 38 y.o. MRN: [id number]  Chief Complaint  Patient presents with   Hand Pain    Pt says both her hands swell up and cramp it started about a month ago   Eye Burn    Pt says her eyes start burning and feeling very dry only happens at night. Pt says it makes it difficult for her to drive at night Started about 15 days ago.    [employer] Language interpreter was used during visit. HPI  Courtney Soto is a 38 y.o female who has history of lower extremity varicose vein who presents for c/o swelling to hands and cramps and dry eye. She states that her hands swells up daily in the morning and it is associated with cramps to her fingers .  She states that swelling affects her holding objects when she wakes up. She states that  swelling resolves when she moves her hands and exercises.  She denies any muscle no motor weakness.  She states that it started a month ago. 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 She also c/o  dryness and burning sensations to eyes at night and it affects her driving. She states that it only happens at night.  She denies any eye pain, discharge, blurry vision.  Overall, she states that she is doing well and offers no further complaints.  Review of Systems  Constitutional: Negative.   Eyes:  Negative for blurred vision, double vision, photophobia, pain, discharge and redness.       Dryness and burning sensation to eyes.  Respiratory: Negative.    Cardiovascular: Negative.   Musculoskeletal:        Swelling to hands  Neurological: Negative.   Psychiatric/Behavioral: Negative.        Objective:     BP 108/73 (BP Location: Right Arm, Patient Position: Sitting, Cuff Size: Normal)   Pulse 79   Ht 5' 3 (1.6 m)   Wt 184 lb (83.5 kg)   SpO2 95%   BMI 32.59 kg/m  BP Readings from Last 3 Encounters:   10/30/24 108/73  07/17/24 105/74  05/29/24 103/72   Wt Readings from Last 3 Encounters:  10/30/24 184 lb (83.5 kg)  07/17/24 187 lb 14.4 oz (85.2 kg)  05/29/24 188 lb (85.3 kg)      Physical Exam HENT:     Head: Normocephalic and atraumatic.     Mouth/Throat:     Mouth: Mucous membranes are moist.     Pharynx: Oropharynx is clear.  Eyes:     Extraocular Movements: Extraocular movements intact.     Conjunctiva/sclera: Conjunctivae normal.     Pupils: Pupils are equal, round, and reactive to light.  Cardiovascular:     Rate and Rhythm: Normal rate and regular rhythm.     Pulses: Normal pulses.     Heart sounds: Normal heart sounds.  Pulmonary:     Effort: Pulmonary effort is normal.     Breath sounds: Normal breath sounds.  Musculoskeletal:        General: No swelling, tenderness or deformity. Normal range of motion.  Skin:    General: Skin is warm.     Capillary Refill: Capillary refill takes less than 2 seconds.  Neurological:     General: No focal deficit present.     Mental Status: She is alert and oriented  to person, place, and time.  Psychiatric:        Mood and Affect: Mood normal.        Behavior: Behavior normal.      No results found for any visits on 10/30/24.  Last CBC Lab Results  Component Value Date   WBC 5.6 01/09/2024   HGB 14.4 01/09/2024   HCT 42.3 01/09/2024   MCV 89 01/09/2024   MCH 30.4 01/09/2024   RDW 12.6 01/09/2024   PLT 285 01/09/2024   Last metabolic panel Lab Results  Component Value Date   GLUCOSE 87 01/09/2024   NA 140 01/09/2024   K 4.3 01/09/2024   CL 105 01/09/2024   CO2 22 01/09/2024   BUN 8 01/09/2024   CREATININE 0.65 01/09/2024   EGFR 117 01/09/2024   CALCIUM 9.2 01/09/2024   PROT 7.2 01/09/2024   ALBUMIN 4.4 01/09/2024   LABGLOB 2.8 01/09/2024   AGRATIO 1.7 05/31/2022   BILITOT 0.9 01/09/2024   ALKPHOS 108 01/09/2024   AST 18 01/09/2024   ALT 18 01/09/2024   ANIONGAP 9 02/11/2022   Last lipids Lab  Results  Component Value Date   CHOL 141 01/09/2024   HDL 38 (L) 01/09/2024   LDLCALC 86 01/09/2024   TRIG 92 01/09/2024   CHOLHDL 3.7 01/09/2024   Last hemoglobin A1c Lab Results  Component Value Date   HGBA1C 5.4 01/09/2024   Last thyroid functions Lab Results  Component Value Date   TSH 1.150 09/28/2022   Last vitamin D  Lab Results  Component Value Date   VD25OH 15.2 (L) 01/09/2024   Last vitamin B12 and Folate No results found for: VITAMINB12, FOLATE    The ASCVD Risk score (Arnett DK, et al., 2019) failed to calculate for the following reasons:   The 2019 ASCVD risk score is only valid for ages 96 to 93    Assessment & Plan:   1. Bilateral dry eyes (Primary) -Unknown etiology of eye dryness, she agrees to apply artificial tears to her eyes.  She was advised to notify clinic and go to the emergency room for worsening symptoms. - artificial tears (LACRILUBE) OINT ophthalmic ointment; Place 1 Application into both eyes at bedtime as needed for dry eyes.  Dispense: 5 g; Refill: 0  2. Swelling of both hands -No edema was observed to her hands, she was advised to notify clinic with any swelling.  She verbalized understand.  Will check her electrolytes on vitamin D . - Comp Met (CMET); Future - VITAMIN D  25 Hydroxy (Vit-D Deficiency, Fractures); Future   Return in about 5 weeks (around 12/04/2024), or if symptoms worsen or fail to improve.    Anabeth Chilcott E Connie Lasater, NP

## 2024-10-31 ENCOUNTER — Other Ambulatory Visit: Payer: Self-pay

## 2024-10-31 ENCOUNTER — Ambulatory Visit: Payer: Self-pay | Admitting: Gerontology

## 2024-10-31 LAB — COMPREHENSIVE METABOLIC PANEL WITH GFR
ALT: 21 IU/L (ref 0–32)
AST: 25 IU/L (ref 0–40)
Albumin: 4.7 g/dL (ref 3.9–4.9)
Alkaline Phosphatase: 102 IU/L (ref 41–116)
BUN/Creatinine Ratio: 12 (ref 9–23)
BUN: 7 mg/dL (ref 6–20)
Bilirubin Total: 0.6 mg/dL (ref 0.0–1.2)
CO2: 16 mmol/L — ABNORMAL LOW (ref 20–29)
Calcium: 9.8 mg/dL (ref 8.7–10.2)
Chloride: 107 mmol/L — ABNORMAL HIGH (ref 96–106)
Creatinine, Ser: 0.6 mg/dL (ref 0.57–1.00)
Globulin, Total: 2.9 g/dL (ref 1.5–4.5)
Glucose: 116 mg/dL — ABNORMAL HIGH (ref 70–99)
Potassium: 4 mmol/L (ref 3.5–5.2)
Sodium: 144 mmol/L (ref 134–144)
Total Protein: 7.6 g/dL (ref 6.0–8.5)
eGFR: 118 mL/min/1.73

## 2024-10-31 LAB — VITAMIN D 25 HYDROXY (VIT D DEFICIENCY, FRACTURES): Vit D, 25-Hydroxy: 18.1 ng/mL — ABNORMAL LOW (ref 30.0–100.0)

## 2024-10-31 MED ORDER — VITAMIN D (ERGOCALCIFEROL) 1.25 MG (50000 UNIT) PO CAPS
50000.0000 [IU] | ORAL_CAPSULE | ORAL | 0 refills | Status: AC
  Filled 2024-10-31 (×2): qty 12, 84d supply, fill #0

## 2024-10-31 NOTE — Addendum Note (Signed)
 Addended by: Nadea Kirkland E on: 10/31/2024 03:20 PM   Modules accepted: Orders

## 2024-11-03 ENCOUNTER — Other Ambulatory Visit (HOSPITAL_COMMUNITY): Payer: Self-pay

## 2024-11-04 ENCOUNTER — Telehealth: Payer: Self-pay

## 2024-11-04 NOTE — Telephone Encounter (Signed)
 Used the spanish interpreter line and Called pt and let pt know their Vitamin D  level was low so an RX was sent in to pharmacy. Pt is to pick up medication and take one capsule every 7 days. Pt verbalized understanding.

## 2024-12-04 ENCOUNTER — Ambulatory Visit: Payer: Self-pay

## 2025-01-01 ENCOUNTER — Ambulatory Visit: Payer: Self-pay
